# Patient Record
Sex: Female | Born: 1965 | Race: White | Hispanic: No | Smoking: Current every day smoker
Health system: Southern US, Community
[De-identification: ages and names within clinical notes are randomized; demographics above are authoritative.]

## PROBLEM LIST (undated history)

## (undated) DIAGNOSIS — K219 Gastro-esophageal reflux disease without esophagitis: Secondary | ICD-10-CM

## (undated) DIAGNOSIS — C55 Malignant neoplasm of uterus, part unspecified: Secondary | ICD-10-CM

## (undated) DIAGNOSIS — Z720 Tobacco use: Secondary | ICD-10-CM

## (undated) DIAGNOSIS — M545 Low back pain, unspecified: Secondary | ICD-10-CM

## (undated) DIAGNOSIS — J42 Unspecified chronic bronchitis: Secondary | ICD-10-CM

## (undated) DIAGNOSIS — G4700 Insomnia, unspecified: Secondary | ICD-10-CM

## (undated) DIAGNOSIS — N12 Tubulo-interstitial nephritis, not specified as acute or chronic: Secondary | ICD-10-CM

## (undated) DIAGNOSIS — F419 Anxiety disorder, unspecified: Secondary | ICD-10-CM

## (undated) DIAGNOSIS — G8929 Other chronic pain: Secondary | ICD-10-CM

## (undated) HISTORY — PX: FRACTURE SURGERY: SHX138

---

## 1991-06-19 HISTORY — PX: TUBAL LIGATION: SHX77

## 1991-06-19 HISTORY — PX: ABDOMINAL HYSTERECTOMY: SHX81

## 2011-06-19 HISTORY — PX: BILATERAL OOPHORECTOMY: SHX1221

## 2013-06-18 HISTORY — PX: FOOT FRACTURE SURGERY: SHX645

## 2015-04-25 ENCOUNTER — Emergency Department (HOSPITAL_COMMUNITY): Payer: Self-pay

## 2015-04-25 ENCOUNTER — Encounter (HOSPITAL_COMMUNITY): Payer: Self-pay | Admitting: *Deleted

## 2015-04-25 ENCOUNTER — Inpatient Hospital Stay (HOSPITAL_COMMUNITY)
Admission: EM | Admit: 2015-04-25 | Discharge: 2015-04-28 | DRG: 872 | Disposition: A | Discharge status: Home / Self Care | Payer: Self-pay | Attending: Internal Medicine | Admitting: Internal Medicine

## 2015-04-25 ENCOUNTER — Emergency Department (HOSPITAL_COMMUNITY): Payer: MEDICAID

## 2015-04-25 ENCOUNTER — Inpatient Hospital Stay (HOSPITAL_COMMUNITY): Payer: Self-pay

## 2015-04-25 DIAGNOSIS — B9620 Unspecified Escherichia coli [E. coli] as the cause of diseases classified elsewhere: Secondary | ICD-10-CM | POA: Diagnosis present

## 2015-04-25 DIAGNOSIS — M5412 Radiculopathy, cervical region: Secondary | ICD-10-CM | POA: Diagnosis present

## 2015-04-25 DIAGNOSIS — IMO0002 Reserved for concepts with insufficient information to code with codable children: Secondary | ICD-10-CM

## 2015-04-25 DIAGNOSIS — R739 Hyperglycemia, unspecified: Secondary | ICD-10-CM

## 2015-04-25 DIAGNOSIS — E1165 Type 2 diabetes mellitus with hyperglycemia: Secondary | ICD-10-CM | POA: Diagnosis present

## 2015-04-25 DIAGNOSIS — E118 Type 2 diabetes mellitus with unspecified complications: Secondary | ICD-10-CM

## 2015-04-25 DIAGNOSIS — G4700 Insomnia, unspecified: Secondary | ICD-10-CM

## 2015-04-25 DIAGNOSIS — R0789 Other chest pain: Secondary | ICD-10-CM | POA: Diagnosis present

## 2015-04-25 DIAGNOSIS — F419 Anxiety disorder, unspecified: Secondary | ICD-10-CM

## 2015-04-25 DIAGNOSIS — R079 Chest pain, unspecified: Secondary | ICD-10-CM

## 2015-04-25 DIAGNOSIS — N12 Tubulo-interstitial nephritis, not specified as acute or chronic: Secondary | ICD-10-CM | POA: Diagnosis present

## 2015-04-25 DIAGNOSIS — E860 Dehydration: Secondary | ICD-10-CM | POA: Insufficient documentation

## 2015-04-25 DIAGNOSIS — R05 Cough: Secondary | ICD-10-CM

## 2015-04-25 DIAGNOSIS — N390 Urinary tract infection, site not specified: Secondary | ICD-10-CM | POA: Diagnosis present

## 2015-04-25 DIAGNOSIS — A419 Sepsis, unspecified organism: Principal | ICD-10-CM | POA: Diagnosis present

## 2015-04-25 DIAGNOSIS — R059 Cough, unspecified: Secondary | ICD-10-CM

## 2015-04-25 DIAGNOSIS — F17210 Nicotine dependence, cigarettes, uncomplicated: Secondary | ICD-10-CM | POA: Diagnosis present

## 2015-04-25 DIAGNOSIS — Z881 Allergy status to other antibiotic agents status: Secondary | ICD-10-CM

## 2015-04-25 DIAGNOSIS — E876 Hypokalemia: Secondary | ICD-10-CM | POA: Diagnosis present

## 2015-04-25 DIAGNOSIS — Z79899 Other long term (current) drug therapy: Secondary | ICD-10-CM

## 2015-04-25 DIAGNOSIS — M542 Cervicalgia: Secondary | ICD-10-CM

## 2015-04-25 DIAGNOSIS — R109 Unspecified abdominal pain: Secondary | ICD-10-CM

## 2015-04-25 HISTORY — DX: Other chronic pain: G89.29

## 2015-04-25 HISTORY — DX: Malignant neoplasm of uterus, part unspecified: C55

## 2015-04-25 HISTORY — DX: Gastro-esophageal reflux disease without esophagitis: K21.9

## 2015-04-25 HISTORY — DX: Anxiety disorder, unspecified: F41.9

## 2015-04-25 HISTORY — DX: Insomnia, unspecified: G47.00

## 2015-04-25 HISTORY — DX: Unspecified chronic bronchitis: J42

## 2015-04-25 HISTORY — DX: Low back pain, unspecified: M54.50

## 2015-04-25 HISTORY — DX: Tobacco use: Z72.0

## 2015-04-25 HISTORY — DX: Tubulo-interstitial nephritis, not specified as acute or chronic: N12

## 2015-04-25 HISTORY — DX: Low back pain: M54.5

## 2015-04-25 LAB — CBG MONITORING, ED: Glucose-Capillary: 375 mg/dL — ABNORMAL HIGH (ref 65–99)

## 2015-04-25 LAB — COMPREHENSIVE METABOLIC PANEL
ALBUMIN: 3.3 g/dL — AB (ref 3.5–5.0)
ALT: 14 U/L (ref 14–54)
ANION GAP: 13 (ref 5–15)
AST: 18 U/L (ref 15–41)
Alkaline Phosphatase: 100 U/L (ref 38–126)
BILIRUBIN TOTAL: 0.6 mg/dL (ref 0.3–1.2)
BUN: 9 mg/dL (ref 6–20)
CHLORIDE: 91 mmol/L — AB (ref 101–111)
CO2: 25 mmol/L (ref 22–32)
Calcium: 9.7 mg/dL (ref 8.9–10.3)
Creatinine, Ser: 0.86 mg/dL (ref 0.44–1.00)
GFR calc Af Amer: >60 mL/min (ref >60)
GFR calc non Af Amer: >60 mL/min (ref >60)
GLUCOSE: 473 mg/dL — AB (ref 65–99)
POTASSIUM: 3.4 mmol/L — AB (ref 3.5–5.1)
SODIUM: 129 mmol/L — AB (ref 135–145)
TOTAL PROTEIN: 7.2 g/dL (ref 6.5–8.1)

## 2015-04-25 LAB — URINALYSIS, ROUTINE W REFLEX MICROSCOPIC
BILIRUBIN URINE: NEGATIVE
Glucose, UA: >1000 mg/dL — AB
Ketones, ur: NEGATIVE mg/dL
Leukocytes, UA: NEGATIVE
Nitrite: POSITIVE — AB
PH: 6 (ref 5.0–8.0)
Protein, ur: 30 mg/dL — AB
SPECIFIC GRAVITY, URINE: 1.039 — AB (ref 1.005–1.030)
UROBILINOGEN UA: 1 mg/dL (ref 0.0–1.0)

## 2015-04-25 LAB — LIPID PANEL
CHOL/HDL RATIO: 13.7 ratio
Cholesterol: 205 mg/dL — ABNORMAL HIGH (ref 0–200)
HDL: 15 mg/dL — AB (ref >40)
LDL Cholesterol: 139 mg/dL — ABNORMAL HIGH (ref 0–99)
Triglycerides: 254 mg/dL — ABNORMAL HIGH (ref <150)
VLDL: 51 mg/dL — AB (ref 0–40)

## 2015-04-25 LAB — URINE MICROSCOPIC-ADD ON

## 2015-04-25 LAB — CBC
HEMATOCRIT: 40.2 % (ref 36.0–46.0)
HEMOGLOBIN: 14.5 g/dL (ref 12.0–15.0)
MCH: 31.4 pg (ref 26.0–34.0)
MCHC: 36.1 g/dL — AB (ref 30.0–36.0)
MCV: 87 fL (ref 78.0–100.0)
Platelets: 263 10*3/uL (ref 150–400)
RBC: 4.62 MIL/uL (ref 3.87–5.11)
RDW: 12.3 % (ref 11.5–15.5)
WBC: 15.6 10*3/uL — ABNORMAL HIGH (ref 4.0–10.5)

## 2015-04-25 LAB — I-STAT CG4 LACTIC ACID, ED
LACTIC ACID, VENOUS: 2.78 mmol/L — AB (ref 0.5–2.0)
LACTIC ACID, VENOUS: 0.55 mmol/L (ref 0.5–2.0)

## 2015-04-25 LAB — GLUCOSE, CAPILLARY: Glucose-Capillary: 192 mg/dL — ABNORMAL HIGH (ref 65–99)

## 2015-04-25 LAB — PROCALCITONIN: PROCALCITONIN: 0.1 ng/mL

## 2015-04-25 LAB — TROPONIN I: TROPONIN I: 0.03 ng/mL (ref <0.031)

## 2015-04-25 LAB — LIPASE, BLOOD: Lipase: 71 U/L — ABNORMAL HIGH (ref 11–51)

## 2015-04-25 MED ORDER — PROMETHAZINE HCL 25 MG PO TABS
25.0000 mg | ORAL_TABLET | Freq: Once | ORAL | Status: AC
  Administered 2015-04-25: 25 mg via ORAL
  Filled 2015-04-25: qty 1

## 2015-04-25 MED ORDER — SODIUM CHLORIDE 0.9 % IV SOLN
1000.0000 mL | Freq: Once | INTRAVENOUS | Status: AC
  Administered 2015-04-25: 1000 mL via INTRAVENOUS

## 2015-04-25 MED ORDER — DEXTROSE 5 % IV SOLN
1.0000 g | INTRAVENOUS | Status: DC
  Administered 2015-04-26 – 2015-04-27 (×2): 1 g via INTRAVENOUS
  Filled 2015-04-25 (×3): qty 10

## 2015-04-25 MED ORDER — MORPHINE SULFATE (PF) 2 MG/ML IV SOLN
2.0000 mg | INTRAVENOUS | Status: DC | PRN
  Administered 2015-04-25 – 2015-04-26 (×4): 2 mg via INTRAVENOUS
  Filled 2015-04-25 (×4): qty 1

## 2015-04-25 MED ORDER — ALPRAZOLAM 0.25 MG PO TABS
0.2500 mg | ORAL_TABLET | Freq: Every evening | ORAL | Status: DC | PRN
  Administered 2015-04-25 – 2015-04-27 (×3): 0.25 mg via ORAL
  Filled 2015-04-25 (×3): qty 1

## 2015-04-25 MED ORDER — INSULIN ASPART 100 UNIT/ML ~~LOC~~ SOLN
0.0000 [IU] | Freq: Three times a day (TID) | SUBCUTANEOUS | Status: DC
  Administered 2015-04-26 (×2): 3 [IU] via SUBCUTANEOUS
  Administered 2015-04-26: 5 [IU] via SUBCUTANEOUS
  Administered 2015-04-27: 3 [IU] via SUBCUTANEOUS
  Administered 2015-04-27: 1 [IU] via SUBCUTANEOUS
  Administered 2015-04-27: 3 [IU] via SUBCUTANEOUS
  Administered 2015-04-28 (×2): 2 [IU] via SUBCUTANEOUS

## 2015-04-25 MED ORDER — ENOXAPARIN SODIUM 40 MG/0.4ML ~~LOC~~ SOLN
40.0000 mg | SUBCUTANEOUS | Status: DC
  Administered 2015-04-25 – 2015-04-27 (×3): 40 mg via SUBCUTANEOUS
  Filled 2015-04-25 (×3): qty 0.4

## 2015-04-25 MED ORDER — HYDROMORPHONE HCL 1 MG/ML IJ SOLN
1.0000 mg | Freq: Once | INTRAMUSCULAR | Status: AC
  Administered 2015-04-25: 1 mg via INTRAVENOUS
  Filled 2015-04-25: qty 1

## 2015-04-25 MED ORDER — ONDANSETRON HCL 4 MG/2ML IJ SOLN
4.0000 mg | Freq: Four times a day (QID) | INTRAMUSCULAR | Status: DC | PRN
  Administered 2015-04-27 (×2): 4 mg via INTRAVENOUS
  Filled 2015-04-25 (×2): qty 2

## 2015-04-25 MED ORDER — POTASSIUM CHLORIDE 10 MEQ/100ML IV SOLN
10.0000 meq | INTRAVENOUS | Status: AC
  Administered 2015-04-25 (×4): 10 meq via INTRAVENOUS
  Filled 2015-04-25 (×4): qty 100

## 2015-04-25 MED ORDER — HYDROMORPHONE HCL 1 MG/ML IJ SOLN
0.5000 mg | Freq: Once | INTRAMUSCULAR | Status: AC
  Administered 2015-04-25: 0.5 mg via INTRAVENOUS
  Filled 2015-04-25: qty 1

## 2015-04-25 MED ORDER — INSULIN ASPART 100 UNIT/ML ~~LOC~~ SOLN
10.0000 [IU] | Freq: Once | SUBCUTANEOUS | Status: AC
  Administered 2015-04-25: 10 [IU] via SUBCUTANEOUS

## 2015-04-25 MED ORDER — ONDANSETRON HCL 4 MG PO TABS: 4.0000 mg | ORAL_TABLET | Freq: Four times a day (QID) | ORAL | Status: DC | PRN

## 2015-04-25 MED ORDER — ACETAMINOPHEN 650 MG RE SUPP: 650.0000 mg | Freq: Four times a day (QID) | RECTAL | Status: DC | PRN

## 2015-04-25 MED ORDER — IOHEXOL 300 MG/ML  SOLN
100.0000 mL | Freq: Once | INTRAMUSCULAR | Status: AC | PRN
  Administered 2015-04-25: 100 mL via INTRAVENOUS

## 2015-04-25 MED ORDER — DEXTROSE 5 % IV SOLN
1.0000 g | Freq: Once | INTRAVENOUS | Status: AC
  Administered 2015-04-25: 1 g via INTRAVENOUS
  Filled 2015-04-25: qty 10

## 2015-04-25 MED ORDER — ACETAMINOPHEN 325 MG PO TABS: 650.0000 mg | ORAL_TABLET | Freq: Four times a day (QID) | ORAL | Status: DC | PRN

## 2015-04-25 MED ORDER — NICOTINE 21 MG/24HR TD PT24
21.0000 mg | MEDICATED_PATCH | Freq: Every day | TRANSDERMAL | Status: DC
  Administered 2015-04-25 – 2015-04-28 (×4): 21 mg via TRANSDERMAL
  Filled 2015-04-25 (×4): qty 1

## 2015-04-25 MED ORDER — SODIUM CHLORIDE 0.9 % IV BOLUS (SEPSIS): 1000.0000 mL | Freq: Once | INTRAVENOUS | Status: DC

## 2015-04-25 MED ORDER — SODIUM CHLORIDE 0.9 % IV SOLN
INTRAVENOUS | Status: DC
  Administered 2015-04-25 – 2015-04-28 (×5): via INTRAVENOUS

## 2015-04-25 MED ORDER — ONDANSETRON HCL 4 MG/2ML IJ SOLN
4.0000 mg | Freq: Once | INTRAMUSCULAR | Status: AC
  Administered 2015-04-25: 4 mg via INTRAVENOUS
  Filled 2015-04-25: qty 2

## 2015-04-25 NOTE — ED Notes (Signed)
Attempted report to 6N 

## 2015-04-25 NOTE — H&P (Signed)
[facility] History and Physical  Bethany Mccarthy WOE:[id number] DOB: 1966/03/29 DOA: 04/25/2015  Referring physician: Dr. Davonna Belling, EDP PCP: No primary care provider on file.  Specialists:   Chief Complaint: Abdominal pain, nausea and vomting  HPI: Bethany Mccarthy is a 49 y.o. female  With past medical history of insomnia and anxiety that presented to the emergency department with complaints of back pain and abdominal pain along with nausea and vomiting. Patient states this is been going on for approximately one week however has been worsening. She has been vomiting more often. She's been unable to drink or eat anything. Patient states she's been having lots of back pain as well as abdominal pain. She also complains of left shoulder and chest pain which has been intermittently going on for approximately 1 week. She states the pain is sharp in character. Patient also complains of dysuria and increased thirst. At this time, she denies any shortness of breath but does admit to continued smoking. She states she is new to the area and does not have a primary care physician.  In the emergency department, patient was noted to have urinary tract infection, CT showed bilateral pyelonephritis. Patient was also found to be septic. Patient also had a blood sugar 473, lactic acid 2.78. TR[facility] called for admission.  Review of Systems:  Constitutional: Denies fever, chills, diaphoresis, appetite change and fatigue.  HEENT: Denies photophobia, eye pain, redness, hearing loss, ear pain, congestion, sore throat, rhinorrhea, sneezing, mouth sores, trouble swallowing, neck pain, neck stiffness and tinnitus.   Respiratory: Denies SOB, DOE, cough, chest tightness,  and wheezing.   Cardiovascular: Denies chest pain, palpitations and leg swelling.  Gastrointestinal: Complains of abdominal pain, nausea and vomiting Genitourinary: Complains of dysuria Musculoskeletal: Complains of back pain  Skin: Denies  pallor, rash and wound.  Neurological: Denies dizziness, seizures, syncope, weakness, light-headedness, numbness and headaches.  Hematological: Denies adenopathy. Easy bruising, personal or family bleeding history  Psychiatric/Behavioral: Denies suicidal ideation, mood changes, confusion, nervousness, sleep disturbance and agitation  Past Medical History  Diagnosis Date  . Gynecologic cancer ([place])   . Anxiety   . Insomnia   . Tobacco abuse    Past surgical history Hysterectomy  Social History:  reports that she has been smoking Cigarettes.  She does not have any smokeless tobacco history on file. She reports that she does not drink alcohol or use illicit drugs.   Allergies  Allergen Reactions  . Levaquin [Levofloxacin In D5w] Hives  . Sulfa Antibiotics Hives   Family History Grandmother had some type of cancer.  Prior to Admission medications   Medication Sig Start Date End Date Taking? Authorizing Provider  ALPRAZolam (XANAX PO) Take 1 tablet by mouth daily as needed (anxiety).   Yes Historical Provider, MD   Physical Exam: Filed Vitals:   04/25/15 1630  BP: 103/80  Pulse: 94  Temp:   Resp: 29     General: Well developed, well nourished, NAD, appears stated age  HEENT: NCAT, PERRLA, EOMI, Anicteic Sclera, mucous membranes moist.   Neck: Supple, no JVD, no masses  Cardiovascular: S1 S2 auscultated, no rubs, murmurs or gallops. Regular rate and rhythm.  Respiratory: Mild expiratory wheeze  Abdomen: Soft, generalized TTP-worse in RUQ , nondistended, + bowel sounds  Back: +CVA tenderness B/L  Extremities: warm dry without cyanosis clubbing or edema  Neuro: AAOx3, cranial nerves grossly intact. Strength 5/5 in patient's upper and lower extremities bilaterally  Skin: Without rashes exudates or nodules  Psych: Anxious however appropriate  Labs on Admission:  Basic Metabolic Panel:  Recent Labs Lab 04/25/15 1057  NA 129*  K 3.4*  CL 91*  CO2 25    GLUCOSE 473*  BUN 9  CREATININE 0.86  CALCIUM 9.7   Liver Function Tests:  Recent Labs Lab 04/25/15 1057  AST 18  ALT 14  ALKPHOS 100  BILITOT 0.6  PROT 7.2  ALBUMIN 3.3*    Recent Labs Lab 04/25/15 1057  LIPASE 71*   No results for input(s): AMMONIA in the last 168 hours. CBC:  Recent Labs Lab 04/25/15 1057  WBC 15.6*  HGB 14.5  HCT 40.2  MCV 87.0  PLT 263   Cardiac Enzymes: No results for input(s): CKTOTAL, CKMB, CKMBINDEX, TROPONINI in the last 168 hours.  BNP (last 3 results) No results for input(s): BNP in the last 8760 hours.  ProBNP (last 3 results) No results for input(s): PROBNP in the last 8760 hours.  CBG:  Recent Labs Lab 04/25/15 1202  GLUCAP 375*    Radiological Exams on Admission: US Abdomen Complete  04/25/2015  CLINICAL DATA:  Right upper quadrant pain EXAM: ULTRASOUND ABDOMEN COMPLETE COMPARISON:  None. FINDINGS: Gallbladder: No gallstones or wall thickening visualized. No sonographic Murphy sign noted. Common bile duct: Diameter: Normal caliber, 3 mm Liver: No focal lesion identified. Within normal limits in parenchymal echogenicity. IVC: No abnormality visualized. Pancreas: Visualized portion unremarkable. Spleen: Size and appearance within normal limits. Right Kidney: Length: 13.3 cm. Echogenicity within normal limits. No mass or hydronephrosis visualized. Left Kidney: Length: 12.3 cm. Echogenicity within normal limits. No mass or hydronephrosis visualized. Abdominal aorta: No aneurysm visualized. Other findings: None. IMPRESSION: Normal study. Electronically Signed   By: Rolm Baptise M.D.   On: 04/25/2015 14:00   Ct Abdomen Pelvis W Contrast  04/25/2015  CLINICAL DATA:  Right lower quadrant and back pain. EXAM: CT ABDOMEN AND PELVIS WITH CONTRAST TECHNIQUE: Multidetector CT imaging of the abdomen and pelvis was performed using the standard protocol following bolus administration of intravenous contrast. CONTRAST:  134mL OMNIPAQUE  IOHEXOL 300 MG/ML  SOLN COMPARISON:  None. FINDINGS: Dependent atelectasis in the lung bases. No effusions. Heart is normal size. Liver, gallbladder, spleen, pancreas, right adrenal is unremarkable. Areas of poor enhancement noted in the kidneys bilaterally including mid and lower pole on the right and predominately lower pole on the left compatible with pyelonephritis. No hydronephrosis. Small nonobstructing stone in the mid to lower pole of the right kidney. Appendix is visualized and is normal. Prior hysterectomy. No adnexal masses. Gas is noted in the urinary bladder presumably from recent catheterization. Recommend clinical correlation. Stomach, large and small bowel unremarkable. Aortic calcifications and atherosclerosis in the aorta and iliac vessels. No free fluid, free air or adenopathy. No acute bony abnormality or focal bone lesion. IMPRESSION: Areas of poor enhancement in the kidneys bilaterally compatible with bilateral pyelonephritis. Right punctate mid to lower pole nephrolithiasis. No hydronephrosis. Aortic atherosclerosis, advanced for patient's age. Gas within the urinary bladder, presumably related to recent catheterization. Recommend clinical correlation. Electronically Signed   By: Rolm Baptise M.D.   On: 04/25/2015 16:06    EKG: None  Assessment/Plan  Sepsis secondary to pyelonephritis -Patient be admitted to medical floor -Upon admission, patient did have leukocytosis with tachycardia, tachypnea, lactic acid of 2.78 -Patient did receive fluids, lactic acid has improved, currently 0.55 -Ultrasound of the abdomen normal -CT pelvis with contrast: Bilateral pyelonephritis, right punctate mid to lower pole nephrolithiasis, no hydronephrosis -UA shows 21 and 50 WBC, many  bacteria, positive nitrites -Urine and blood cultures pending -Will place patient on ceftriaxone -Continue pain control  Chest pain/left shoulder pain -Will obtain CXR, EKG and cycle troponins -Suspect secondary  to nausea and vomiting  Hyperglycemia -Likely has undiagnosed diabetes -Blood sugar upon admission 473 -Anion gap 13 -Will place patient on insulin size to CBG monitoring and obtain hemoglobin A1c  Pseudohyponatremia/Hypochloremia/ Dehydration -Likely secondary to hyperglycemia, dehydration -Continue to monitor BMP, will place on normal saline  Hypokalemia -Will replace  -obtain magnesium level  Nausea and vomiting -Likely secondary to sepsis/pyelonephritis -Will place on clear liquid diet and advance as tolerated to carp modified -Will place on PRN antiemetics  Anxiety/insomnia -Continue Xanax QHS PRN  Tobacco abuse -Discussed smoking cessation counseling -Will order nicotine patch  DVT prophylaxis: Lovenox  Code Status: Full  Condition: guarded   Family Communication: None at bedside.  Admission, patients condition and plan of care including tests being ordered have been discussed with the patient, who indicates understanding and agrees with the plan and Code Status.  Disposition Plan: Admitted  Time spent: 65 minutes  Levora Werden D.O. [facility] Pager [phone]  If 7PM-7AM, please contact night-coverage [email or web address] Password [email or web address] 04/25/2015, 4:50 PM

## 2015-04-25 NOTE — ED Notes (Signed)
Pt reports bilateral lower back pain, lower abd pain and n/v for days. Denies urinary symptoms. Also reports fever, fatigue and left arm tingling x 3 days.

## 2015-04-25 NOTE — ED Notes (Signed)
Patient transported to CT 

## 2015-04-25 NOTE — ED Notes (Signed)
Patient transported to Ultrasound 

## 2015-04-25 NOTE — ED Notes (Signed)
Pt in CT.

## 2015-04-25 NOTE — ED Provider Notes (Signed)
CSN: [id number]     Arrival date & time 04/25/15  1007 History   First MD Initiated Contact with Patient 04/25/15 1152     Chief Complaint  Patient presents with  . Back Pain  . Abdominal Pain  . Emesis     (Consider location/radiation/quality/duration/timing/severity/associated sxs/prior Treatment) The history is provided by the patient and medical records. No language interpreter was used.    Bethany Mccarthy is a 49 y.o. female  with no significant PMH who presents to the Emergency Department complaining of worsening generalized, constant abdominal pain x 1 week. Patient denies any alleviating or aggravating factors and denies history of similar sxs. Associated symptoms include nausea, NBNB emesis. Pt. Also admits to increased thirst. Patient admits Patient states no history of DM.   History reviewed. No pertinent past medical history. History reviewed. No pertinent past surgical history. History reviewed. No pertinent family history. Social History  Substance Use Topics  . Smoking status: Current Every Day Smoker    Types: Cigarettes  . Smokeless tobacco: None  . Alcohol Use: No   OB History    No data available     Review of Systems  Constitutional: Positive for fever (Subjective) and appetite change. Negative for activity change and unexpected weight change.  HENT: Positive for rhinorrhea. Negative for congestion and sore throat.   Eyes: Negative for visual disturbance.  Respiratory: Negative for cough, shortness of breath and wheezing.   Cardiovascular: Negative.   Gastrointestinal: Positive for nausea, vomiting and abdominal pain. Negative for diarrhea and constipation.  Endocrine: Positive for polydipsia. Negative for polyuria.  Genitourinary: Negative for dysuria and urgency.  Musculoskeletal: Negative for myalgias, back pain, arthralgias and neck pain.  Skin: Negative for rash.  Neurological: Negative for dizziness, weakness and headaches.      Allergies   Levaquin and Sulfa antibiotics  Home Medications   Prior to Admission medications   Medication Sig Start Date End Date Taking? Authorizing Provider  ALPRAZolam (XANAX PO) Take 1 tablet by mouth daily as needed (anxiety).   Yes Historical Provider, MD   BP 117/65 mmHg  Pulse 74  Temp(Src) 98 F (36.7 C) (Oral)  Resp 13  Ht 5\' 6"  (1.676 m)  Wt 140 lb (63.504 kg)  BMI 22.61 kg/m2  SpO2 99% Physical Exam  Constitutional: She is oriented to person, place, and time. She appears well-developed and well-nourished.  Alert, appears in pain and uncomfortable but in NAD  HENT:  Head: Normocephalic and atraumatic.  Cardiovascular: Normal rate, regular rhythm, normal heart sounds and intact distal pulses.  Exam reveals no gallop and no friction rub.   No murmur heard. Pulmonary/Chest: Effort normal and breath sounds normal. No respiratory distress. She has no wheezes. She has no rales. She exhibits no tenderness.  Abdominal: She exhibits no mass. There is no rebound.  Abdomen soft, non-distended Generalized tenderness to palpation, worst in RUQ (+) Murphy's  Bowel sounds positive in all four quadrants   Musculoskeletal: She exhibits no edema.  Neurological: She is alert and oriented to person, place, and time.  Alert, oriented, thought content appropriate, able to give a coherent history. Speech is clear and goal oriented, able to follow commands.   Cranial Nerves:  II:  Peripheral visual fields grossly normal, pupils equal, round, reactive to light III, IV, VI: EOM intact bilaterally, ptosis not present V,VII: smile symmetric, eyes kept closed tightly against resistance, facial light touch sensation equal IX, X: symmetric soft palate movement, uvula elevates symmetrically  XI: bilateral shoulder  shrug symmetric and strong XII: midline tongue extension  5/5 muscle strength in upper and lower extremities bilaterally including strong and equal grip strength and dorsiflexion/plantar  flexion Sensory to light touch normal in bilateral LE's and RUE Decreased sensation of left UE      Skin: Skin is warm and dry. No rash noted.  Psychiatric: She has a normal mood and affect. Her behavior is normal. Judgment and thought content normal.  Nursing note and vitals reviewed.   ED Course  Procedures (including critical care time) Labs Review Labs Reviewed  LIPASE, BLOOD - Abnormal; Notable for the following:    Lipase 71 (*)    All other components within normal limits  COMPREHENSIVE METABOLIC PANEL - Abnormal; Notable for the following:    Sodium 129 (*)    Potassium 3.4 (*)    Chloride 91 (*)    Glucose, Bld 473 (*)    Albumin 3.3 (*)    All other components within normal limits  CBC - Abnormal; Notable for the following:    WBC 15.6 (*)    MCHC 36.1 (*)    All other components within normal limits  URINALYSIS, ROUTINE W REFLEX MICROSCOPIC (NOT AT [facility]) - Abnormal; Notable for the following:    APPearance CLOUDY (*)    Specific Gravity, Urine 1.039 (*)    Glucose, UA >1000 (*)    Hgb urine dipstick SMALL (*)    Protein, ur 30 (*)    Nitrite POSITIVE (*)    All other components within normal limits  URINE MICROSCOPIC-ADD ON - Abnormal; Notable for the following:    Squamous Epithelial / LPF FEW (*)    Bacteria, UA MANY (*)    All other components within normal limits  I-STAT CG4 LACTIC ACID, ED - Abnormal; Notable for the following:    Lactic Acid, Venous 2.78 (*)    All other components within normal limits  CBG MONITORING, ED - Abnormal; Notable for the following:    Glucose-Capillary 375 (*)    All other components within normal limits  URINE CULTURE  I-STAT CG4 LACTIC ACID, ED    Imaging Review US Abdomen Complete  04/25/2015  CLINICAL DATA:  Right upper quadrant pain EXAM: ULTRASOUND ABDOMEN COMPLETE COMPARISON:  None. FINDINGS: Gallbladder: No gallstones or wall thickening visualized. No sonographic Murphy sign noted. Common bile duct: Diameter:  Normal caliber, 3 mm Liver: No focal lesion identified. Within normal limits in parenchymal echogenicity. IVC: No abnormality visualized. Pancreas: Visualized portion unremarkable. Spleen: Size and appearance within normal limits. Right Kidney: Length: 13.3 cm. Echogenicity within normal limits. No mass or hydronephrosis visualized. Left Kidney: Length: 12.3 cm. Echogenicity within normal limits. No mass or hydronephrosis visualized. Abdominal aorta: No aneurysm visualized. Other findings: None. IMPRESSION: Normal study. Electronically Signed   By: Rolm Baptise M.D.   On: 04/25/2015 14:00   Ct Abdomen Pelvis W Contrast  04/25/2015  CLINICAL DATA:  Right lower quadrant and back pain. EXAM: CT ABDOMEN AND PELVIS WITH CONTRAST TECHNIQUE: Multidetector CT imaging of the abdomen and pelvis was performed using the standard protocol following bolus administration of intravenous contrast. CONTRAST:  133mL OMNIPAQUE IOHEXOL 300 MG/ML  SOLN COMPARISON:  None. FINDINGS: Dependent atelectasis in the lung bases. No effusions. Heart is normal size. Liver, gallbladder, spleen, pancreas, right adrenal is unremarkable. Areas of poor enhancement noted in the kidneys bilaterally including mid and lower pole on the right and predominately lower pole on the left compatible with pyelonephritis. No hydronephrosis. Small nonobstructing stone  in the mid to lower pole of the right kidney. Appendix is visualized and is normal. Prior hysterectomy. No adnexal masses. Gas is noted in the urinary bladder presumably from recent catheterization. Recommend clinical correlation. Stomach, large and small bowel unremarkable. Aortic calcifications and atherosclerosis in the aorta and iliac vessels. No free fluid, free air or adenopathy. No acute bony abnormality or focal bone lesion. IMPRESSION: Areas of poor enhancement in the kidneys bilaterally compatible with bilateral pyelonephritis. Right punctate mid to lower pole nephrolithiasis. No  hydronephrosis. Aortic atherosclerosis, advanced for patient's age. Gas within the urinary bladder, presumably related to recent catheterization. Recommend clinical correlation. Electronically Signed   By: Rolm Baptise M.D.   On: 04/25/2015 16:06   I have personally reviewed and evaluated these images and lab results as part of my medical decision-making.   EKG Interpretation None      MDM   Final diagnoses:  Abdominal pain  Pyelonephritis   Vaeda Westall presents with worsening abdominal pain x 1 week. (+) murphys, history, exam leans toward gallbladder as likely etiology. Elevated glucose also a consideration. Pancreatitis considered but would expect lipase to be higher and this is not as consistent with history.  Labs: CBG 375, Lactic acid 2.78, Lipase 71, CMP: na 129, k 3.4, cl 91, glucose 473, albumin 3.3; white count 15.6 U/S: normal study, no gallstones or wall thickening.  UA: nitrite +, many bacteria - patient has UTI but will still CT abd because pain level exceeds that of uncomplicated UTI. Will r/o GI etiology for pain. No ketones in urine, DKA not likely.  CT shows signs compatible with bilateral pyelonephritis which is consistent with history and physical exam findings.   Consult to internal medicine who will admit.  Urine culture added and Rocephin started in ED per IM recommendations.   Patient seen by and discussed with Dr. Alvino Chapel who agrees with treatment plan.    [facility] Carmisha Larusso, PA-C 04/25/15 1633  Davonna Belling, MD 04/25/15 [phone]

## 2015-04-26 ENCOUNTER — Encounter (HOSPITAL_COMMUNITY): Payer: Self-pay | Admitting: General Practice

## 2015-04-26 DIAGNOSIS — IMO0002 Reserved for concepts with insufficient information to code with codable children: Secondary | ICD-10-CM

## 2015-04-26 DIAGNOSIS — E1165 Type 2 diabetes mellitus with hyperglycemia: Secondary | ICD-10-CM

## 2015-04-26 LAB — GLUCOSE, CAPILLARY
GLUCOSE-CAPILLARY: 215 mg/dL — AB (ref 65–99)
Glucose-Capillary: 217 mg/dL — ABNORMAL HIGH (ref 65–99)
Glucose-Capillary: 279 mg/dL — ABNORMAL HIGH (ref 65–99)

## 2015-04-26 LAB — CBC
HEMATOCRIT: 35 % — AB (ref 36.0–46.0)
Hemoglobin: 12.1 g/dL (ref 12.0–15.0)
MCH: 30.8 pg (ref 26.0–34.0)
MCHC: 34.6 g/dL (ref 30.0–36.0)
MCV: 89.1 fL (ref 78.0–100.0)
Platelets: 230 10*3/uL (ref 150–400)
RBC: 3.93 MIL/uL (ref 3.87–5.11)
RDW: 12.2 % (ref 11.5–15.5)
WBC: 9.6 10*3/uL (ref 4.0–10.5)

## 2015-04-26 LAB — BASIC METABOLIC PANEL
Anion gap: 7 (ref 5–15)
CHLORIDE: 102 mmol/L (ref 101–111)
CO2: 25 mmol/L (ref 22–32)
Calcium: 8.2 mg/dL — ABNORMAL LOW (ref 8.9–10.3)
Creatinine, Ser: 0.53 mg/dL (ref 0.44–1.00)
GFR calc Af Amer: >60 mL/min (ref >60)
GLUCOSE: 216 mg/dL — AB (ref 65–99)
POTASSIUM: 3.6 mmol/L (ref 3.5–5.1)
Sodium: 134 mmol/L — ABNORMAL LOW (ref 135–145)

## 2015-04-26 LAB — HEMOGLOBIN A1C
Hgb A1c MFr Bld: 10.1 % — ABNORMAL HIGH (ref 4.8–5.6)
MEAN PLASMA GLUCOSE: 243 mg/dL

## 2015-04-26 LAB — TROPONIN I
Troponin I: <0.03 ng/mL (ref <0.031)
Troponin I: 0.03 ng/mL (ref <0.031)

## 2015-04-26 MED ORDER — INSULIN GLARGINE 100 UNIT/ML ~~LOC~~ SOLN
10.0000 [IU] | Freq: Every day | SUBCUTANEOUS | Status: DC
  Administered 2015-04-26 – 2015-04-27 (×2): 10 [IU] via SUBCUTANEOUS
  Filled 2015-04-26 (×3): qty 0.1

## 2015-04-26 MED ORDER — FLUOXETINE HCL 10 MG PO CAPS
10.0000 mg | ORAL_CAPSULE | Freq: Every day | ORAL | Status: DC
  Administered 2015-04-26 – 2015-04-28 (×3): 10 mg via ORAL
  Filled 2015-04-26 (×4): qty 1

## 2015-04-26 MED ORDER — FLUCONAZOLE 100 MG PO TABS
150.0000 mg | ORAL_TABLET | Freq: Once | ORAL | Status: AC
  Administered 2015-04-26: 150 mg via ORAL
  Filled 2015-04-26: qty 2

## 2015-04-26 MED ORDER — LIVING WELL WITH DIABETES BOOK
Freq: Once | Status: AC
  Administered 2015-04-26: 11:00:00
  Filled 2015-04-26: qty 1

## 2015-04-26 MED ORDER — WHITE PETROLATUM GEL
Status: AC
  Administered 2015-04-26: 18:00:00
  Filled 2015-04-26: qty 1

## 2015-04-26 MED ORDER — HYDROCODONE-ACETAMINOPHEN 5-325 MG PO TABS
1.0000 | ORAL_TABLET | ORAL | Status: DC | PRN
  Administered 2015-04-26 – 2015-04-28 (×8): 2 via ORAL
  Filled 2015-04-26 (×8): qty 2

## 2015-04-26 MED ORDER — MORPHINE SULFATE (PF) 2 MG/ML IV SOLN
2.0000 mg | INTRAVENOUS | Status: DC | PRN
  Administered 2015-04-26 – 2015-04-28 (×9): 2 mg via INTRAVENOUS
  Filled 2015-04-26 (×9): qty 1

## 2015-04-26 NOTE — Progress Notes (Signed)
Brief Nutrition Note  RD received consult for diet education. Pt with newly diagnosed DM.   Lab Results  Component Value Date   HGBA1C 10.1* 04/25/2015   Spoke with RN, who reports pt is aware of diagnosis as MD had just been discussing elevated CBGS with pt earlier this AM.  Pt was sleeping soundly at time of visit; unable to arise enough for education. Left AND Nutrition Care Manual's "Carbohydrate Counting for People with Diabetes" handout at bedside for pt to review.   Will attempt to follow-up with pt for reinforcement of principles.   Current diet order is regular, pt consuming 100% of meals. Consider changing diet order to carb modified.   Marce Schartz A. Jimmye Norman, RD, LDN, CDE Pager: [phone] After hours Pager: [phone]

## 2015-04-26 NOTE — Progress Notes (Addendum)
Inpatient Diabetes Program Recommendations  AACE/ADA: New Consensus Statement on Inpatient Glycemic Control (2015)  Target Ranges:  Prepandial:   less than 140 mg/dL      Peak postprandial:   less than 180 mg/dL (1-2 hours)      Critically ill patients:  140 - 180 mg/dL  Results for Bethany Mccarthy, Bethany Mccarthy (MRN [id number]) as of 04/26/2015 10:12  Ref. Range 04/25/2015 12:02 04/25/2015 21:41 04/26/2015 07:53  Glucose-Capillary Latest Ref Range: 65-99 mg/dL 375 (H) 192 (H) 217 (H)  Results for Bethany Mccarthy, Bethany Mccarthy (MRN [id number]) as of 04/26/2015 10:12  Ref. Range 04/25/2015 10:57 04/25/2015 19:17 04/26/2015 05:45  Hemoglobin A1C Latest Ref Range: 4.8-5.6 %  10.1 (H)   Glucose Latest Ref Range: 65-99 mg/dL 473 (H)  216 (H)   Review of Glycemic Control  Diabetes history: No Outpatient Diabetes medications: NA Current orders for Inpatient glycemic control: Novolog 0-9 units TID with meals  Inpatient Diabetes Program Recommendations: Oral Agents: May want to consider starting patient on affordable oral DM medication such as Metformin and Amaryl. HgbA1C: A1C 10.1% on 04/25/15 which meets ADA criteria for diagnosis of DM. MD, please inform patient of new DM dx. Also, inform nursing staff so they can begin DM education.  04/26/15@14 :40-Spoke with patient about new diabetes diagnosis. Patient reports that her grandmother had diabetes and had several complications from diabetes. Patient also stated that she was told about 2 years ago in [place] that her "pancreas did not work." Patient states that she was not told anything else about her pancreas and was never started on any type of medication. Patient does not have any insurance and no PCP.  Discussed A1C results (10.1% on 04/25/15) and explained what an A1C is, basic pathophysiology of DM Type 2, basic home care, importance of checking CBGs and maintaining good CBG control to prevent long-term and short-term complications. Discussed impact of nutrition, exercise,  stress, sickness, and medications on diabetes control. Discussed carbohydrates, carbohydrate goals per day and meal, along with portion sizes. 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 Informed patient that RD had been consulted and will provide further information on Carb Modified diet. Informed patient about [facility] and that Case Management would be consulted for arrange follow up and to see if they can provide any assistance with medications at time of discharge. Patient will need affordable DM medications for discharge. Recommend trying patient on oral DM medication while inpatient to determine effect on glucose and then have patient follow up at [facility] for glycemic control. Discussed glucose and A1C goals and explained how to monitor glucose. Patient states that she is familiar with how to check glucose as she saw her grandmother check her glucose in the past. Encouraged patient to check glucose as recommended by the doctor and to keep a log of glucose readings which she will need to take with her to follow up visits. Explained how glucose log can be used to help the doctor make adjustments with DM medications. Informed patient she should be receiving the Living Well with Diabetes Booklet and that needs to read through the book so any questions she may have can be addressed before discharge.  Patient verbalized understanding of information discussed and she states that she has no further questions at this time related to diabetes. RNs to provide ongoing basic DM education at bedside with this patient and  engage patient to actively check blood glucose and administer insulin injections.       At time of discharge, patient will need a prescription for a glucometer and testing supplies along with DM medications. If patient follows up at the [facility]  and [facility] she will be able to take the prescription for glucometer and testing supplies there and get supplies from the [facility] pharmacy. Also, if patient is prescribed a DM medication the clinic has samples of then she can take the prescription there and they will provide her with a sample of the medication.  Thanks, Barnie Alderman, RN, MSN, CDE Diabetes Coordinator Inpatient Diabetes Program [phone] (Team Pager from [place] to [place]) [phone] ([place] office) [phone] [facility] office) [phone] [facility] office)

## 2015-04-26 NOTE — Progress Notes (Signed)
[facility] PROGRESS NOTE  Bethany Mccarthy KGM:[id number] DOB: 1966-05-09 DOA: 04/25/2015 PCP: No PCP Per Patient  Brief Summary  Bethany Mccarthy is a 49 y.o. female with history of insomnia and anxiety who recently moved her from [place] who presented to the emergency department with back pain, abdominal pain, nausea and vomiting.  She also complained of left shoulder and chest pain which had been intermittently going on for approximately 1 week. She endorsed dysuria and increased thirst.   In the emergency department, she was found to have a urinary tract infection, CT showed bilateral pyelonephritis.  She was septic with tachycardia and leukocytosis. Patient also had a blood sugar 473, lactic acid 2.78. [facility] called for admission.  Assessment/Plan  Sepsis secondary to pyelonephritis -Upon admission, patient did have leukocytosis with tachycardia, tachypnea, lactic acid of 2.78 -Ultrasound of the abdomen normal -CT pelvis with contrast: Bilateral pyelonephritis, right punctate mid to lower pole nephrolithiasis, no hydronephrosis -UA shows 21 and 50 WBC, many bacteria, positive nitrites -Urine and blood cultures pending - Continue ceftriaxone - change to vicodin with morphine for breakthrough pain -  Continue antiemetics  Chest pain/left shoulder pain, likely due to cervical radiculopathy -  Asked her to talk to her PCP about a referral for injections if it does not improve - Troponins negative -  CXR unremarkable  New diagnosis of diabetes mellitus - A1c 10.1 -  CBG still elevated -  Start lantus 10 units  -  Continue low dose SSI   Pseudohyponatremia/Hypochloremia/ Dehydration, resolving with IVF and correction of hyperglycemia -  Continue IVF -  Repeat BMP in AM  Hypokalemia, resolved with supplementation -Will replace  -obtain magnesium level  Nausea and vomiting, likely secondary to sepsis/pyelonephritis - resolving -  Advance to diabetic  diet  Anxiety/insomnia -Continue Xanax QHS PRN -  Start fluoxetine  Tobacco abuse -Discussed smoking cessation counseling - nicotine patch  Diet:  diabetic Access:  PIV IVF:  yes Proph:  lovenox  Code Status: full Family Communication: patient alone Disposition Plan: pending tolerating diet, further education about diabetes   Consultants:  Diabetic coordinator  Procedures:  CT ab/p  Abd [place]  Antibiotics:  Ceftriaxone 11/7 >   HPI/Subjective:  Denies vomiting and states that she has not had much nausea.  Her pain medication is not lasting very long and she asked me to adjust her medications.  She feels anxious and has been crying.  Pain has not improved.  No BMs since admission.    Objective: Filed Vitals:   04/25/15 1730 04/25/15 1752 04/25/15 2122 04/26/15 0458  BP: 130/75 118/70 102/52 100/66  Pulse: 84 80 81 78  Temp:  98 F (36.7 C) 98.4 F (36.9 C) 98.4 F (36.9 C)  TempSrc:  Oral Oral Oral  Resp: 14 16 17 19   Height:  5\' 6"  (1.676 m)    Weight:  63.5 kg (139 lb 15.9 oz)    SpO2: 100% 100% 100% 100%    Intake/Output Summary (Last 24 hours) at 04/26/15 1234 Last data filed at 04/26/15 0900  Gross per 24 hour  Intake   2070 ml  Output   1900 ml  Net    170 ml   Filed Weights   04/25/15 1054 04/25/15 1752  Weight: 63.504 kg (140 lb) 63.5 kg (139 lb 15.9 oz)   Body mass index is 22.61 kg/(m^2).  Exam:   General:  Tearful adult female, No acute distress  HEENT:  NCAT, MMM, loss of bilateral nasolabial folds suggestive  of botox injection  Cardiovascular:  RRR, nl S1, S2 no mrg, 2+ pulses, warm extremities  Respiratory:  CTAB, no increased WOB  Abdomen:   NABS, soft, ND, mild TTP diffusely without rebound or guarding  MSK:   Normal tone and bulk, no LEE  Neuro:  Grossly intact  Data Reviewed: Basic Metabolic Panel:  Recent Labs Lab 04/25/15 1057 04/26/15 0545  NA 129* 134*  K 3.4* 3.6  CL 91* 102  CO2 25 25  GLUCOSE 473* 216*   BUN 9 <5*  CREATININE 0.86 0.53  CALCIUM 9.7 8.2*   Liver Function Tests:  Recent Labs Lab 04/25/15 1057  AST 18  ALT 14  ALKPHOS 100  BILITOT 0.6  PROT 7.2  ALBUMIN 3.3*    Recent Labs Lab 04/25/15 1057  LIPASE 71*   No results for input(s): AMMONIA in the last 168 hours. CBC:  Recent Labs Lab 04/25/15 1057 04/26/15 0545  WBC 15.6* 9.6  HGB 14.5 12.1  HCT 40.2 35.0*  MCV 87.0 89.1  PLT 263 230    No results found for this or any previous visit (from the past 240 hour(s)).   Studies: US Abdomen Complete  04/25/2015  CLINICAL DATA:  Right upper quadrant pain EXAM: ULTRASOUND ABDOMEN COMPLETE COMPARISON:  None. FINDINGS: Gallbladder: No gallstones or wall thickening visualized. No sonographic Murphy sign noted. Common bile duct: Diameter: Normal caliber, 3 mm Liver: No focal lesion identified. Within normal limits in parenchymal echogenicity. IVC: No abnormality visualized. Pancreas: Visualized portion unremarkable. Spleen: Size and appearance within normal limits. Right Kidney: Length: 13.3 cm. Echogenicity within normal limits. No mass or hydronephrosis visualized. Left Kidney: Length: 12.3 cm. Echogenicity within normal limits. No mass or hydronephrosis visualized. Abdominal aorta: No aneurysm visualized. Other findings: None. IMPRESSION: Normal study. Electronically Signed   By: Rolm Baptise M.D.   On: 04/25/2015 14:00   Ct Abdomen Pelvis W Contrast  04/25/2015  CLINICAL DATA:  Right lower quadrant and back pain. EXAM: CT ABDOMEN AND PELVIS WITH CONTRAST TECHNIQUE: Multidetector CT imaging of the abdomen and pelvis was performed using the standard protocol following bolus administration of intravenous contrast. CONTRAST:  137mL OMNIPAQUE IOHEXOL 300 MG/ML  SOLN COMPARISON:  None. FINDINGS: Dependent atelectasis in the lung bases. No effusions. Heart is normal size. Liver, gallbladder, spleen, pancreas, right adrenal is unremarkable. Areas of poor enhancement noted in  the kidneys bilaterally including mid and lower pole on the right and predominately lower pole on the left compatible with pyelonephritis. No hydronephrosis. Small nonobstructing stone in the mid to lower pole of the right kidney. Appendix is visualized and is normal. Prior hysterectomy. No adnexal masses. Gas is noted in the urinary bladder presumably from recent catheterization. Recommend clinical correlation. Stomach, large and small bowel unremarkable. Aortic calcifications and atherosclerosis in the aorta and iliac vessels. No free fluid, free air or adenopathy. No acute bony abnormality or focal bone lesion. IMPRESSION: Areas of poor enhancement in the kidneys bilaterally compatible with bilateral pyelonephritis. Right punctate mid to lower pole nephrolithiasis. No hydronephrosis. Aortic atherosclerosis, advanced for patient's age. Gas within the urinary bladder, presumably related to recent catheterization. Recommend clinical correlation. Electronically Signed   By: Rolm Baptise M.D.   On: 04/25/2015 16:06   Dg Chest Port 1 View  04/25/2015  CLINICAL DATA:  Left chest pain radiating into the left shoulder for 1 week. Initial encounter. EXAM: PORTABLE CHEST 1 VIEW COMPARISON:  None. FINDINGS: Heart size and mediastinal contours are within normal limits. Both lungs  are clear. Visualized skeletal structures are unremarkable. IMPRESSION: Negative exam. Electronically Signed   By: Inge Rise M.D.   On: 04/25/2015 18:40    Scheduled Meds: . cefTRIAXone (ROCEPHIN)  IV  1 g Intravenous Q24H  . enoxaparin (LOVENOX) injection  40 mg Subcutaneous Q24H  . FLUoxetine  10 mg Oral Daily  . insulin aspart  0-9 Units Subcutaneous TID WC  . living well with diabetes book   Does not apply Once  . nicotine  21 mg Transdermal Daily   Continuous Infusions: . sodium chloride 125 mL/hr at 04/26/15 1026    Active Problems:   Pyelonephritis   Sepsis secondary to UTI ([place])   Hyperglycemia   Insomnia    Anxiety   Chest pain    Time spent: 30 min    Jaquaveon Bilal, [facility] Pager [phone]. If 7PM-7AM, please contact night-coverage at [email or web address], password [facility] 04/26/2015, 12:34 PM  LOS: 1 day

## 2015-04-26 NOTE — Care Management Note (Signed)
Case Management Note  Patient Details  Name: Bethany Mccarthy MRN: [id number] Date of Birth: 06/25/1965  Subjective/Objective:                    Action/Plan:  Will provide [place] letter  On day of discharge ,and [facility] information  Expected Discharge Date:                  Expected Discharge Plan:  Home/Self Care  In-House Referral:     Discharge planning Services  CM Consult, [facility], [facility] Program, Medication Assistance  Post Acute Care Choice:    Choice offered to:     DME Arranged:    DME Agency:     HH Arranged:    HH Agency:     Status of Service:  In process, will continue to follow  Medicare Important Message Given:    Date Medicare IM Given:    Medicare IM give by:    Date Additional Medicare IM Given:    Additional Medicare Important Message give by:     If discussed at [place] of Stay Meetings, dates discussed:    Additional Comments:  Marilu Favre, RN 04/26/2015, 8:03 AM

## 2015-04-27 ENCOUNTER — Inpatient Hospital Stay (HOSPITAL_COMMUNITY): Payer: Self-pay

## 2015-04-27 DIAGNOSIS — F419 Anxiety disorder, unspecified: Secondary | ICD-10-CM

## 2015-04-27 DIAGNOSIS — G4700 Insomnia, unspecified: Secondary | ICD-10-CM

## 2015-04-27 DIAGNOSIS — E1165 Type 2 diabetes mellitus with hyperglycemia: Secondary | ICD-10-CM

## 2015-04-27 DIAGNOSIS — R739 Hyperglycemia, unspecified: Secondary | ICD-10-CM

## 2015-04-27 DIAGNOSIS — E118 Type 2 diabetes mellitus with unspecified complications: Secondary | ICD-10-CM

## 2015-04-27 DIAGNOSIS — N390 Urinary tract infection, site not specified: Secondary | ICD-10-CM

## 2015-04-27 DIAGNOSIS — A419 Sepsis, unspecified organism: Principal | ICD-10-CM

## 2015-04-27 DIAGNOSIS — N12 Tubulo-interstitial nephritis, not specified as acute or chronic: Secondary | ICD-10-CM

## 2015-04-27 LAB — GLUCOSE, CAPILLARY
GLUCOSE-CAPILLARY: 216 mg/dL — AB (ref 65–99)
GLUCOSE-CAPILLARY: 166 mg/dL — AB (ref 65–99)
Glucose-Capillary: 242 mg/dL — ABNORMAL HIGH (ref 65–99)
Glucose-Capillary: 217 mg/dL — ABNORMAL HIGH (ref 65–99)
Glucose-Capillary: 222 mg/dL — ABNORMAL HIGH (ref 65–99)

## 2015-04-27 MED ORDER — LORAZEPAM 2 MG/ML IJ SOLN
1.0000 mg | Freq: Once | INTRAMUSCULAR | Status: AC
  Administered 2015-04-27: 1 mg via INTRAVENOUS
  Filled 2015-04-27: qty 1

## 2015-04-27 MED ORDER — GABAPENTIN 100 MG PO CAPS
100.0000 mg | ORAL_CAPSULE | Freq: Two times a day (BID) | ORAL | Status: DC
  Administered 2015-04-27 (×2): 100 mg via ORAL
  Filled 2015-04-27 (×2): qty 1

## 2015-04-27 MED ORDER — INSULIN ASPART 100 UNIT/ML ~~LOC~~ SOLN
3.0000 [IU] | Freq: Three times a day (TID) | SUBCUTANEOUS | Status: DC
  Administered 2015-04-27 – 2015-04-28 (×3): 3 [IU] via SUBCUTANEOUS

## 2015-04-27 NOTE — Progress Notes (Addendum)
[facility]                                                                              Patient Demographics  Bethany Mccarthy, is a 49 y.o. female, DOB - Nov 05, 1965, ZWC:[id number]  Admit date - 04/25/2015   Admitting Physician Cristal Ford, DO  Outpatient Primary MD for the patient is No PCP Per Patient  LOS - 2   Chief Complaint  Patient presents with  . Back Pain  . Abdominal Pain  . Emesis       Brief HPI   Bethany Mccarthy is a 49 y.o. female with history of insomnia and anxiety who recently moved her from [place] who presented to the emergency department with back pain, abdominal pain, nausea and vomiting. She also complained of left shoulder and chest pain which had been intermittently going on for approximately 1 week. She endorsed dysuria and increased thirst. In the emergency department, she was found to have a urinary tract infection, CT showed bilateral pyelonephritis. She was septic with tachycardia and leukocytosis. Patient also had a blood sugar 473, lactic acid 2.78. [facility] called for admission.   Assessment & Plan    Sepsis secondary to pyelonephritis -patient met sepsis criteria on admission, patient did have leukocytosis with tachycardia, tachypnea, lactic acid of 2.78 -Ultrasound of the abdomen normal -CT pelvis with contrast: Bilateral pyelonephritis, right punctate mid to lower pole nephrolithiasis, no hydronephrosis -UA shows 21 and 50 WBC, many bacteria, positive nitrites -Follow urine culture and blood culture and sensitivities. Urine culture showing Escherichia coli, sensitivities pending -Continue IV Rocephin, pain control and antiemetics    Neck /left shoulder pain, likely due to cervical radiculopathy -  MRI of the C-spine and shows broad-based left paracentral protrusion, mild kyphosis at C5-C6, resulting in flattening of the cord worse on the left, mild to moderate foraminal narrowing at this level worse on the left,  shallow broad-based disc bulge at C5-C6 - Continue pain control, added Neurontin - Called neurosurgery consult discussed with Dr. Ronnald Ramp, recommended pain control at this time. Follow-up outpatient, once infection is cleared, will likely need cervical epidural injection.     New diagnosis of diabetes mellitus - A1c 10.1 - CBG still elevated, added meal coverage - Start lantus 10 units,  Continue low dose SSI  - Patient requesting oral hypoglycemic at the time of discharge. - Nutrition consult for diet education  Pseudohyponatremia/Hypochloremia/ Dehydration, resolving with IVF and correction of hyperglycemia - resolved with IVF   Hypokalemia, resolved with supplementation  Nausea and vomiting, likely secondary to sepsis/pyelonephritis - resolving, cont diabetic diet  Anxiety/insomnia -Continue Xanax QHS PRN - Start fluoxetine  Tobacco abuse -Discussed smoking cessation counseling - nicotine patch   Code Status: full code  Family Communication: Discussed in detail with the patient, all imaging results, lab results explained to the patient    Disposition Plan: Once culture results are available, likely DC in am  Time Spent in minutes   25  minutes  Procedures  MRI of the C-spine  CT abdomen and pelvis  Ultrasound abdomen  Consults   Neurosurgery  DVT Prophylaxis  Lovenox  Medications  Scheduled Meds: . cefTRIAXone (ROCEPHIN)  IV  1 g Intravenous Q24H  . enoxaparin (LOVENOX) injection  40 mg Subcutaneous Q24H  . FLUoxetine  10 mg Oral Daily  . gabapentin  100 mg Oral BID  . insulin aspart  0-9 Units Subcutaneous TID WC  . insulin glargine  10 Units Subcutaneous QHS  . nicotine  21 mg Transdermal Daily   Continuous Infusions: . sodium chloride 75 mL/hr at 04/27/15 0840   PRN Meds:.acetaminophen **OR** acetaminophen, ALPRAZolam, HYDROcodone-acetaminophen, morphine injection, ondansetron **OR** ondansetron (ZOFRAN) IV   Antibiotics   Anti-infectives     Start     Dose/Rate Route Frequency Ordered Stop   04/26/15 1730  fluconazole (DIFLUCAN) tablet 150 mg     150 mg Oral  Once 04/26/15 1725 04/26/15 1739   04/26/15 1630  cefTRIAXone (ROCEPHIN) 1 g in dextrose 5 % 50 mL IVPB     1 g 100 mL/hr over 30 Minutes Intravenous Every 24 hours 04/25/15 1652     04/25/15 1630  cefTRIAXone (ROCEPHIN) 1 g in dextrose 5 % 50 mL IVPB     1 g 100 mL/hr over 30 Minutes Intravenous  Once 04/25/15 1627 04/25/15 1707        Subjective:   Bethany Mccarthy was seen and examined today.  Still having lower quadrant abdominal pain, back pain, neck pain 5/10 radiating to the left shoulder and arm with tingling. Denies any chest pain, nausea, vomiting. Afebrile.    Objective:   Blood pressure 146/72, pulse 70, temperature 98.4 F (36.9 C), temperature source Oral, resp. rate 19, height 5' 6" (1.676 m), weight 63.5 kg (139 lb 15.9 oz), SpO2 99 %.  Wt Readings from Last 3 Encounters:  04/25/15 63.5 kg (139 lb 15.9 oz)     Intake/Output Summary (Last 24 hours) at 04/27/15 1233 Last data filed at 04/27/15 0500  Gross per 24 hour  Intake    598 ml  Output   2000 ml  Net  -1402 ml    Exam  General: Alert and oriented x 3, NAD  HEENT:  PERRLA, EOMI, Anicteric Sclera, mucous membranes moist.   Neck: Supple, no JVD, no masses  CVS: S1 S2 auscultated, no rubs, murmurs or gallops. Regular rate and rhythm.  Respiratory: Clear to auscultation bilaterally, no wheezing, rales or rhonchi  Abdomen: Soft, mild diffuse tenderness , nondistended, + bowel sounds  Ext: no cyanosis clubbing or edema  Neuro: AAOx3, Cr N's II- XII. Strength 5/5 upper and lower extremities bilaterally  Skin: No rashes  Psych: Normal affect and demeanor, alert and oriented x3    Data Review   Micro Results Recent Results (from the past 240 hour(s))  Urine culture     Status: None (Preliminary result)   Collection Time: 04/25/15 12:12 PM  Result Value Ref Range Status    Specimen Description URINE, CLEAN CATCH  Final   Special Requests NONE  Final   Culture   Final    >=100,000 COLONIES/mL ESCHERICHIA COLI 50,000 COLONIES/mL GRAM POSITIVE COCCI    Report Status PENDING  Incomplete  Culture, blood (x 2)     Status: None (Preliminary result)   Collection Time: 04/25/15  7:22 PM  Result Value Ref Range Status   Specimen Description BLOOD RIGHT ARM  Final   Special Requests BOTTLES DRAWN AEROBIC AND ANAEROBIC 5CC  Final   Culture NO GROWTH 2 DAYS  Final   Report Status PENDING  Incomplete  Culture, blood (x 2)     Status:  None (Preliminary result)   Collection Time: 04/25/15  7:31 PM  Result Value Ref Range Status   Specimen Description BLOOD RIGHT HAND  Final   Special Requests BOTTLES DRAWN AEROBIC ONLY 5CC  Final   Culture NO GROWTH 2 DAYS  Final   Report Status PENDING  Incomplete    Radiology Reports Mr Cervical Spine Wo Contrast  04/27/2015  CLINICAL DATA:  Neck pain with left shoulder and arm pain and tingling for 3 days. No known injury. Initial encounter. EXAM: MRI CERVICAL SPINE WITHOUT CONTRAST TECHNIQUE: Multiplanar, multisequence MR imaging of the cervical spine was performed. No intravenous contrast was administered. COMPARISON:  None. FINDINGS: There is mild kyphosis about the C5-6 level. Vertebral body height, signal and alignment are maintained. The craniocervical junction is normal and cervical cord signal is normal. Imaged paraspinous structures are unremarkable. C2-3:  Negative. C3-4:  Negative. C4-5: A left paracentral protrusion contacts and slightly deforms the ventral aspect of the left hemi cord. The foramina are widely patent. C5-6: Broad-based left paracentral protrusion and uncovertebral disease are seen. Combined with mild kyphosis, the disc results in flattening of the ventral cord. Mild to moderate foraminal narrowing is more notable on the left. C6-7: Shallow broad-based disc bulge results in some flattening of the ventral cord.  The foramina are widely patent. C7-T1:  Negative. IMPRESSION: Broad-based left paracentral protrusion and mild kyphosis at C5-6 result in flattening of the cord, worse on the left. Mild to moderate foraminal narrowing at this level is worse on the left. Shallow broad-based disc bulge at C5-6 results in some flattening of the ventral cord. Electronically Signed   By: Inge Rise M.D.   On: 04/27/2015 10:58   US Abdomen Complete  04/25/2015  CLINICAL DATA:  Right upper quadrant pain EXAM: ULTRASOUND ABDOMEN COMPLETE COMPARISON:  None. FINDINGS: Gallbladder: No gallstones or wall thickening visualized. No sonographic Murphy sign noted. Common bile duct: Diameter: Normal caliber, 3 mm Liver: No focal lesion identified. Within normal limits in parenchymal echogenicity. IVC: No abnormality visualized. Pancreas: Visualized portion unremarkable. Spleen: Size and appearance within normal limits. Right Kidney: Length: 13.3 cm. Echogenicity within normal limits. No mass or hydronephrosis visualized. Left Kidney: Length: 12.3 cm. Echogenicity within normal limits. No mass or hydronephrosis visualized. Abdominal aorta: No aneurysm visualized. Other findings: None. IMPRESSION: Normal study. Electronically Signed   By: Rolm Baptise M.D.   On: 04/25/2015 14:00   Ct Abdomen Pelvis W Contrast  04/25/2015  CLINICAL DATA:  Right lower quadrant and back pain. EXAM: CT ABDOMEN AND PELVIS WITH CONTRAST TECHNIQUE: Multidetector CT imaging of the abdomen and pelvis was performed using the standard protocol following bolus administration of intravenous contrast. CONTRAST:  157m OMNIPAQUE IOHEXOL 300 MG/ML  SOLN COMPARISON:  None. FINDINGS: Dependent atelectasis in the lung bases. No effusions. Heart is normal size. Liver, gallbladder, spleen, pancreas, right adrenal is unremarkable. Areas of poor enhancement noted in the kidneys bilaterally including mid and lower pole on the right and predominately lower pole on the left  compatible with pyelonephritis. No hydronephrosis. Small nonobstructing stone in the mid to lower pole of the right kidney. Appendix is visualized and is normal. Prior hysterectomy. No adnexal masses. Gas is noted in the urinary bladder presumably from recent catheterization. Recommend clinical correlation. Stomach, large and small bowel unremarkable. Aortic calcifications and atherosclerosis in the aorta and iliac vessels. No free fluid, free air or adenopathy. No acute bony abnormality or focal bone lesion. IMPRESSION: Areas of poor enhancement in the kidneys bilaterally  compatible with bilateral pyelonephritis. Right punctate mid to lower pole nephrolithiasis. No hydronephrosis. Aortic atherosclerosis, advanced for patient's age. Gas within the urinary bladder, presumably related to recent catheterization. Recommend clinical correlation. Electronically Signed   By: Rolm Baptise M.D.   On: 04/25/2015 16:06   Dg Chest Port 1 View  04/25/2015  CLINICAL DATA:  Left chest pain radiating into the left shoulder for 1 week. Initial encounter. EXAM: PORTABLE CHEST 1 VIEW COMPARISON:  None. FINDINGS: Heart size and mediastinal contours are within normal limits. Both lungs are clear. Visualized skeletal structures are unremarkable. IMPRESSION: Negative exam. Electronically Signed   By: Inge Rise M.D.   On: 04/25/2015 18:40    CBC  Recent Labs Lab 04/25/15 1057 04/26/15 0545  WBC 15.6* 9.6  HGB 14.5 12.1  HCT 40.2 35.0*  PLT 263 230  MCV 87.0 89.1  MCH 31.4 30.8  MCHC 36.1* 34.6  RDW 12.3 12.2    Chemistries   Recent Labs Lab 04/25/15 1057 04/26/15 0545  NA 129* 134*  K 3.4* 3.6  CL 91* 102  CO2 25 25  GLUCOSE 473* 216*  BUN 9 <5*  CREATININE 0.86 0.53  CALCIUM 9.7 8.2*  AST 18  --   ALT 14  --   ALKPHOS 100  --   BILITOT 0.6  --    ------------------------------------------------------------------------------------------------------------------ estimated creatinine clearance  is 82.3 mL/min (by C-G formula based on Cr of 0.53). ------------------------------------------------------------------------------------------------------------------  Recent Labs  04/25/15 1917  HGBA1C 10.1*   ------------------------------------------------------------------------------------------------------------------  Recent Labs  04/25/15 1917  CHOL 205*  HDL 15*  LDLCALC 139*  TRIG 254*  CHOLHDL 13.7   ------------------------------------------------------------------------------------------------------------------ No results for input(s): TSH, T4TOTAL, T3FREE, THYROIDAB in the last 72 hours.  Invalid input(s): FREET3 ------------------------------------------------------------------------------------------------------------------ No results for input(s): VITAMINB12, FOLATE, FERRITIN, TIBC, IRON, RETICCTPCT in the last 72 hours.  Coagulation profile No results for input(s): INR, PROTIME in the last 168 hours.  No results for input(s): DDIMER in the last 72 hours.  Cardiac Enzymes  Recent Labs Lab 04/25/15 1917 04/26/15 0104 04/26/15 0545  TROPONINI 0.03 <0.03 0.03   ------------------------------------------------------------------------------------------------------------------ Invalid input(s): POCBNP   Recent Labs  04/26/15 0753 04/26/15 1252 04/26/15 1655 04/26/15 2035 04/27/15 0737 04/27/15 1212  GLUCAP 217* 279* 215* 242* 216* 23*     RAI,RIPUDEEP M.D. [facility] 04/27/2015, 12:33 PM  Pager: [phone] Between 7am to 7pm - call Pager - [phone]  After 7pm go to [email or web address] - password [email or web address]  Call night coverage person covering after 7pm

## 2015-04-27 NOTE — Care Management Note (Signed)
Case Management Note  Patient Details  Name: Bethany Mccarthy MRN: [id number] Date of Birth: [birth year]  Subjective/Objective:                    Action/Plan:  Explained [place] letter and provided [employer] information to patient.   Patient needs  Glucometer, called [facility] OP spoke with Butch Penny  , they have free machines however test strips are $60, [employer] has  Best price on machines and test strips are $14 . Patient aware .   Expected Discharge Date:                  Expected Discharge Plan:  Home/Self Care  In-House Referral:     Discharge planning Services  CM Consult, [facility], [facility] Program, Medication Assistance  Post Acute Care Choice:    Choice offered to:  Patient  DME Arranged:    DME Agency:     HH Arranged:    [place] Agency:     Status of Service:  Completed, signed off  Medicare Important Message Given:    Date Medicare IM Given:    Medicare IM give by:    Date Additional Medicare IM Given:    Additional Medicare Important Message give by:     If discussed at [place] of Stay Meetings, dates discussed:    Additional Comments:  Marilu Favre, RN 04/27/2015, 2:13 PM

## 2015-04-27 NOTE — Plan of Care (Signed)
Problem: Food- and Nutrition-Related Knowledge Deficit (NB-1.1) Goal: Nutrition education Formal process to instruct or train a patient/client in a skill or to impart knowledge to help patients/clients voluntarily manage or modify food choices and eating behavior to maintain or improve health. Outcome: Adequate for Discharge  RD consulted for nutrition education regarding diabetes.     Lab Results  Component Value Date    HGBA1C 10.1* 04/25/2015   Spoke with pt to reinforce DM diet principles. She reports she watched the DM videos yesterday. She was able to express good recall of concepts discussed in video. RD reinforced diet principles and pt was very receptive. Pt concerned about discharge planning and able to access meter and medications at discharge; noted CM consult. Findings discussed with RN.   RD provided "Carbohydrate Counting for People with Diabetes" handout from the Academy of Nutrition and Dietetics. Discussed different food groups and their effects on blood sugar, emphasizing carbohydrate-containing foods. Provided list of carbohydrates and recommended serving sizes of common foods.  Discussed importance of controlled and consistent carbohydrate intake throughout the day. Provided examples of ways to balance meals/snacks and encouraged intake of high-fiber, whole grain complex carbohydrates. Teach back method used.  Expect fair to good compliance.  Body mass index is 22.61 kg/(m^2). Pt meets criteria for normal weight, based on current BMI.  Current diet order is Carb Modified, patient is consuming approximately 100% of meals at this time. Labs and medications reviewed. No further nutrition interventions warranted at this time. RD contact information provided. If additional nutrition issues arise, please re-consult RD.  Erial Fikes A. Jimmye Norman, RD, LDN, CDE Pager: [phone] After hours Pager: [phone]

## 2015-04-28 DIAGNOSIS — R079 Chest pain, unspecified: Secondary | ICD-10-CM

## 2015-04-28 LAB — URINE CULTURE

## 2015-04-28 LAB — GLUCOSE, CAPILLARY
Glucose-Capillary: 168 mg/dL — ABNORMAL HIGH (ref 65–99)
Glucose-Capillary: 182 mg/dL — ABNORMAL HIGH (ref 65–99)

## 2015-04-28 MED ORDER — NICOTINE 21 MG/24HR TD PT24: 21.0000 mg | MEDICATED_PATCH | Freq: Every day | TRANSDERMAL | Status: DC

## 2015-04-28 MED ORDER — CEPHALEXIN 500 MG PO CAPS
1000.0000 mg | ORAL_CAPSULE | Freq: Two times a day (BID) | ORAL | Status: DC
  Administered 2015-04-28: 1000 mg via ORAL
  Filled 2015-04-28: qty 2

## 2015-04-28 MED ORDER — INSULIN REGULAR HUMAN 100 UNIT/ML IJ SOLN: INTRAMUSCULAR | Status: AC

## 2015-04-28 MED ORDER — LIDOCAINE 5 % EX PTCH
1.0000 | MEDICATED_PATCH | CUTANEOUS | Status: DC
  Administered 2015-04-28: 1 via TRANSDERMAL
  Filled 2015-04-28: qty 1

## 2015-04-28 MED ORDER — PRAVASTATIN SODIUM 10 MG PO TABS: 10.0000 mg | ORAL_TABLET | Freq: Every day | ORAL | Status: DC

## 2015-04-28 MED ORDER — FLUOXETINE HCL 10 MG PO CAPS: 10.0000 mg | ORAL_CAPSULE | Freq: Every day | ORAL | Status: DC

## 2015-04-28 MED ORDER — HYDROCODONE-ACETAMINOPHEN 5-325 MG PO TABS: 1.0000 | ORAL_TABLET | ORAL | Status: DC | PRN

## 2015-04-28 MED ORDER — ALPRAZOLAM 0.25 MG PO TABS: 0.2500 mg | ORAL_TABLET | Freq: Every evening | ORAL | Status: AC | PRN

## 2015-04-28 MED ORDER — GLIMEPIRIDE 2 MG PO TABS: 2.0000 mg | ORAL_TABLET | Freq: Every day | ORAL | Status: DC

## 2015-04-28 MED ORDER — GABAPENTIN 300 MG PO CAPS: 300.0000 mg | ORAL_CAPSULE | Freq: Two times a day (BID) | ORAL | Status: DC

## 2015-04-28 MED ORDER — LIDOCAINE 5 % EX PTCH: 1.0000 | MEDICATED_PATCH | CUTANEOUS | Status: AC

## 2015-04-28 MED ORDER — INSULIN SYRINGES (DISPOSABLE) U-100 0.5 ML MISC: Status: AC

## 2015-04-28 MED ORDER — METFORMIN HCL 1000 MG PO TABS: 1000.0000 mg | ORAL_TABLET | Freq: Two times a day (BID) | ORAL | Status: DC

## 2015-04-28 MED ORDER — CEPHALEXIN 500 MG PO CAPS: 1000.0000 mg | ORAL_CAPSULE | Freq: Two times a day (BID) | ORAL | Status: DC

## 2015-04-28 MED ORDER — PROMETHAZINE HCL 12.5 MG PO TABS: 12.5000 mg | ORAL_TABLET | Freq: Four times a day (QID) | ORAL | Status: DC | PRN

## 2015-04-28 MED ORDER — PREDNISONE 10 MG (21) PO TBPK: 10.0000 mg | ORAL_TABLET | Freq: Every day | ORAL | Status: DC

## 2015-04-28 MED ORDER — PREDNISONE 50 MG PO TABS
60.0000 mg | ORAL_TABLET | Freq: Every day | ORAL | Status: DC
  Administered 2015-04-28: 60 mg via ORAL
  Filled 2015-04-28 (×2): qty 1

## 2015-04-28 MED ORDER — TRAMADOL HCL 50 MG PO TABS: 50.0000 mg | ORAL_TABLET | Freq: Four times a day (QID) | ORAL | Status: DC | PRN

## 2015-04-28 MED ORDER — INSULIN GLARGINE 100 UNIT/ML ~~LOC~~ SOLN
14.0000 [IU] | Freq: Every day | SUBCUTANEOUS | Status: DC
  Filled 2015-04-28: qty 0.14

## 2015-04-28 MED ORDER — INSULIN STARTER KIT- SYRINGES (ENGLISH)
1.0000 | Freq: Once | Status: AC
  Administered 2015-04-28: 1
  Filled 2015-04-28: qty 1

## 2015-04-28 MED ORDER — INSULIN STARTER KIT- SYRINGES (ENGLISH): 1.0000 | Freq: Once | Status: AC

## 2015-04-28 MED ORDER — BLOOD GLUCOSE MONITOR KIT: PACK | Status: AC

## 2015-04-28 MED ORDER — GABAPENTIN 300 MG PO CAPS
300.0000 mg | ORAL_CAPSULE | Freq: Two times a day (BID) | ORAL | Status: DC
  Administered 2015-04-28: 300 mg via ORAL
  Filled 2015-04-28: qty 1

## 2015-04-28 NOTE — Progress Notes (Signed)
Inpatient Diabetes Program Recommendations  AACE/ADA: New Consensus Statement on Inpatient Glycemic Control (2015)  Target Ranges:  Prepandial:   less than 140 mg/dL      Peak postprandial:   less than 180 mg/dL (1-2 hours)      Critically ill patients:  140 - 180 mg/dL   Review of Glycemic Control:  Results for RENETTA, SUMAN (MRN [id number]) as of 04/28/2015 10:12  Ref. Range 04/27/2015 12:12 04/27/2015 16:44 04/27/2015 21:49 04/28/2015 07:53  Glucose-Capillary Latest Ref Range: 65-99 mg/dL 217 (H) 222 (H) 166 (H) 168 (H)   Diabetes history: New onset diabetes  Spoke with Dr. Tana Coast regarding discharge plans for patient.  She will d/c patient on oral agents metformin and Amaryl which are both available on the (4$) list from [facility].  Patient will also be on 5 day steroid dose pack and therefore blood sugars will likely increase.  Recommend Novolin R- Correction scale before meals and at bedtime per moderate correction scale. Correction scale prior to meals three times a day 121-150- 2 units 151-200- 3 units 201-250- 5 units 251-300- 8 units 301-350- 11 units 351-400- 15 units > 400- 15 units and call MD  Bedtime correction scale: 201-250-2 units 251-300-3 units 301-350-4 units 351-400-5 units >400- 5 units and call MD  Discussed at length with patient.  Also called [facility] regarding availability of medications/ meter.  It may still be best for patient to get medications from [facility] for diabetes.  She states she has already practiced administration of insulin and drawing insulin up.  Ordered insulin starter kit.  Explained plan of correction Regular insulin at discharge until she follows up at [facility] on November 21.  Will follow.  Thanks, Adah Perl, RN, BC-ADM Inpatient Diabetes Coordinator Pager [phone] (8a-5p)

## 2015-04-28 NOTE — Progress Notes (Signed)
Servando Snare to be D/C'd home per MD order. Discussed with the patient and all questions fully answered.  VSS, Skin clean, dry and intact without evidence of skin break down, no evidence of skin tears noted.  IV catheter discontinued intact. Site without signs and symptoms of complications. Dressing and pressure applied.  An After Visit Summary was printed and given to the patient. Patient received prescriptions and insulin starter kit.  Went over extensively how to check CBG, use sliding scale, draw up, and administer insulin.  Patient able to verbalize and demonstrate understanding.  Appointment [facility] at [facility] for [facility] up.  D/c education completed with patient/family including follow up instructions, medication list, d/c activities limitations if indicated, with other d/c instructions as indicated by MD - patient able to verbalize understanding, all questions fully answered.   Patient instructed to return to ED, call 911, or call MD for any changes in condition.   Patient to be escorted via [place], and D/C home via private auto.

## 2015-04-28 NOTE — Discharge Summary (Signed)
Physician Discharge Summary   Patient ID: Bethany Mccarthy MRN: [id number] DOB/AGE: 26-Apr-1966 49 y.o.  Admit date: 04/25/2015 Discharge date: 04/28/2015  Primary Care Physician:  No PCP Per Patient  Discharge Diagnoses:    . Pyelonephritis . Sepsis secondary to UTI, Escherichia coli UTI   New onset diabetes mellitus Hyperlipidemia  Cervical radiculopathy, C5-C6  Consults:   Neurosurgery, Dr. Sherley Bounds Diabetic coordinator Nutrition consult   Recommendations for Outpatient Follow-up:  Patient started on metformin 1000 mg twice a day and Amaryl 2 mg daily  Patient is also placed on prednisone Dosepak for her cervical radiculopathy, expecting her blood sugars to be high, patient is started on Novolin sliding scale insulin while she is on steroids  TESTS THAT NEED FOLLOW-UP Hemoglobin A1c in 3 months   DIET: Carb modified diet    Allergies:   Allergies  Allergen Reactions  . Levaquin [Levofloxacin In D5w] Hives  . Sulfa Antibiotics Hives     Discharge Medications:   Medication List    TAKE these medications        ALPRAZolam 0.25 MG tablet  Commonly known as:  XANAX  Take 1 tablet (0.25 mg total) by mouth at bedtime as needed (anxiety).     blood glucose meter kit and supplies Kit  Dispense based on patient and insurance preference. Use up to four times daily as directed. (FOR ICD-9 250.00, 250.01).     cephALEXin 500 MG capsule  Commonly known as:  KEFLEX  Take 2 capsules (1,000 mg total) by mouth 2 (two) times daily. X 10days     FLUoxetine 10 MG capsule  Commonly known as:  PROZAC  Take 1 capsule (10 mg total) by mouth daily.     gabapentin 300 MG capsule  Commonly known as:  NEURONTIN  Take 1 capsule (300 mg total) by mouth 2 (two) times daily.     glimepiride 2 MG tablet  Commonly known as:  AMARYL  Take 1 tablet (2 mg total) by mouth daily with breakfast.     HYDROcodone-acetaminophen 5-325 MG tablet  Commonly known as:  NORCO/VICODIN   Take 1-2 tablets by mouth every 4 (four) hours as needed for moderate pain or severe pain.     insulin regular 100 units/mL injection  Commonly known as:  NOVOLIN R  Correction scale prior to meals three times a day 121-150- 2 units,  151-200- 3 units,  201-250- 5 units,  251-300- 8 units,  301-350- 11 units, 351-400- 15 units, > 400- 15 units and call MD  Bedtime correction scale: 201-250-2 units,  251-300-3 units,  301-350-4 units, 351-400-5 units, >400- 5 units and call MD     insulin starter kit- syringes Misc  1 kit by Other route once.     Insulin Syringes (Disposable) U-100 0.5 ML Misc  Type 2 diabetes mellitus     lidocaine 5 %  Commonly known as:  LIDODERM  Place 1 patch onto the skin daily. Remove & Discard patch within 12 hours or as directed by MD     metFORMIN 1000 MG tablet  Commonly known as:  GLUCOPHAGE  Take 1 tablet (1,000 mg total) by mouth 2 (two) times daily with a meal.     nicotine 21 mg/24hr patch  Commonly known as:  NICODERM CQ - dosed in mg/24 hours  Place 1 patch (21 mg total) onto the skin daily.     pravastatin 10 MG tablet  Commonly known as:  PRAVACHOL  Take 1 tablet (10 mg total)  by mouth at bedtime.     predniSONE 10 MG (21) Tbpk tablet  Commonly known as:  STERAPRED UNI-PAK 21 TAB  Take 1 tablet (10 mg total) by mouth daily. Take as directed on the pack     promethazine 12.5 MG tablet  Commonly known as:  PHENERGAN  Take 1 tablet (12.5 mg total) by mouth every 6 (six) hours as needed for nausea or vomiting.         Brief H and P: For complete details please refer to admission H and P, but in brief the patient is a 49 year old female with anxiety, insomnia, recently moved from [place] presented to ED with back pain, abdominal pain, nausea and vomiting. She also reported left shoulder and chest pain, intermittently for one week. She endorsed dysuria and increased thirst. In ED patient was found to have UTI, CT showed bilateral  pyelonephritis. Patient met sepsis criteria with tachycardia, leukocytosis, UTI. She was also found to have blood sugar of 473 with lactic acid of 2.78 tried hospitalist service was requested for admission.  Hospital Course:   SIRS/sepsis secondary to pyelonephritis, Escherichia coli UTI -patient met sepsis criteria on admission, patient did have leukocytosis with tachycardia, tachypnea, lactic acid of 2.78 -Ultrasound of the abdomen normal -CT pelvis with contrast showed Bilateral pyelonephritis, right punctate mid to lower pole nephrolithiasis, no hydronephrosis -Blood cultures remain negative. Patient was placed on IV Rocephin. Urine culture showed Escherichia coli sensitive to IV Rocephin and Ancef. It was sensitive to ciprofloxacin however patient had prior history of hives with Levaquin and did not remember if she had ever taken ciprofloxacin. Hence patient was placed on Keflex for 10 days given bilateral pyelonephritis, she has received 3 days of IV antibiotics while inpatient.   Neck /left shoulder pain, likely due to cervical radiculopathy - MRI of the C-spine and shows broad-based left paracentral protrusion, mild kyphosis at C5-C6, resulting in flattening of the cord worse on the left, mild to moderate foraminal narrowing at this level worse on the left, shallow broad-based disc bulge at C5-C6 - Continue pain control, added Neurontin, increased to 300 mg twice a day  - Neurosurgery was consulted, discussed in detail with Dr. Sherley Bounds, recommended pain control at this time. Follow-up outpatient, once infection is cleared, will likely need cervical epidural injection. Follow-up with Dr. Jenny Reichmann schedule outpatient. Also placed on Sterapred pack.  New diagnosis of diabetes mellitus - A1c 10.1. Patient was placed on Lantus and sliding scale insulin while inpatient. Diabetic coordinator consult, nutrition consult for obtained. Patient was placed on metformin and Amaryl at discharge and  Novolin sliding scale while patient is on steroids.   Pseudohyponatremia/Hypochloremia/ Dehydration,  -  Resolved with IVF and correction of hyperglycemia  Hypokalemia, resolved with supplementation  Nausea and vomiting, likely secondary to sepsis/pyelonephritis - resolving, cont diabetic diet  Anxiety/insomnia -Continue Xanax QHS PRN - Start fluoxetine  Tobacco abuse -Discussed smoking cessation counseling - nicotine patch   Day of Discharge BP 127/72 mmHg  Pulse 59  Temp(Src) 98.2 F (36.8 C) (Oral)  Resp 17  Ht _0  (1.676 m)  Wt 63.5 kg (139 lb 15.9 oz)  BMI 22.61 kg/m2  SpO2 97%  Physical Exam: General: Alert and awake oriented x3 not in any acute distress. HEENT: anicteric sclera, pupils reactive to light and accommodation CVS: S1-S2 clear no murmur rubs or gallops Chest: clear to auscultation bilaterally, no wheezing rales or rhonchi Abdomen: soft nontender, nondistended, normal bowel sounds Extremities: no cyanosis, clubbing or  edema noted bilaterally    The results of significant diagnostics from this hospitalization (including imaging, microbiology, ancillary and laboratory) are listed below for reference.    LAB RESULTS: Basic Metabolic Panel:  Recent Labs Lab 04/25/15 1057 04/26/15 0545  NA 129* 134*  K 3.4* 3.6  CL 91* 102  CO2 25 25  GLUCOSE 473* 216*  BUN 9 <5*  CREATININE 0.86 0.53  CALCIUM 9.7 8.2*   Liver Function Tests:  Recent Labs Lab 04/25/15 1057  AST 18  ALT 14  ALKPHOS 100  BILITOT 0.6  PROT 7.2  ALBUMIN 3.3*    Recent Labs Lab 04/25/15 1057  LIPASE 71*   No results for input(s): AMMONIA in the last 168 hours. CBC:  Recent Labs Lab 04/25/15 1057 04/26/15 0545  WBC 15.6* 9.6  HGB 14.5 12.1  HCT 40.2 35.0*  MCV 87.0 89.1  PLT 263 230   Cardiac Enzymes:  Recent Labs Lab 04/26/15 0104 04/26/15 0545  TROPONINI <0.03 0.03   BNP: Invalid input(s): POCBNP CBG:  Recent Labs Lab 04/28/15 0753  04/28/15 1159  GLUCAP 168* 182*    Significant Diagnostic Studies:  US Abdomen Complete  04/25/2015  CLINICAL DATA:  Right upper quadrant pain EXAM: ULTRASOUND ABDOMEN COMPLETE COMPARISON:  None. FINDINGS: Gallbladder: No gallstones or wall thickening visualized. No sonographic Murphy sign noted. Common bile duct: Diameter: Normal caliber, 3 mm Liver: No focal lesion identified. Within normal limits in parenchymal echogenicity. IVC: No abnormality visualized. Pancreas: Visualized portion unremarkable. Spleen: Size and appearance within normal limits. Right Kidney: Length: 13.3 cm. Echogenicity within normal limits. No mass or hydronephrosis visualized. Left Kidney: Length: 12.3 cm. Echogenicity within normal limits. No mass or hydronephrosis visualized. Abdominal aorta: No aneurysm visualized. Other findings: None. IMPRESSION: Normal study. Electronically Signed   By: Rolm Baptise M.D.   On: 04/25/2015 14:00   Ct Abdomen Pelvis W Contrast  04/25/2015  CLINICAL DATA:  Right lower quadrant and back pain. EXAM: CT ABDOMEN AND PELVIS WITH CONTRAST TECHNIQUE: Multidetector CT imaging of the abdomen and pelvis was performed using the standard protocol following bolus administration of intravenous contrast. CONTRAST:  146m OMNIPAQUE IOHEXOL 300 MG/ML  SOLN COMPARISON:  None. FINDINGS: Dependent atelectasis in the lung bases. No effusions. Heart is normal size. Liver, gallbladder, spleen, pancreas, right adrenal is unremarkable. Areas of poor enhancement noted in the kidneys bilaterally including mid and lower pole on the right and predominately lower pole on the left compatible with pyelonephritis. No hydronephrosis. Small nonobstructing stone in the mid to lower pole of the right kidney. Appendix is visualized and is normal. Prior hysterectomy. No adnexal masses. Gas is noted in the urinary bladder presumably from recent catheterization. Recommend clinical correlation. Stomach, large and small bowel  unremarkable. Aortic calcifications and atherosclerosis in the aorta and iliac vessels. No free fluid, free air or adenopathy. No acute bony abnormality or focal bone lesion. IMPRESSION: Areas of poor enhancement in the kidneys bilaterally compatible with bilateral pyelonephritis. Right punctate mid to lower pole nephrolithiasis. No hydronephrosis. Aortic atherosclerosis, advanced for patient's age. Gas within the urinary bladder, presumably related to recent catheterization. Recommend clinical correlation. Electronically Signed   By: KRolm BaptiseM.D.   On: 04/25/2015 16:06   Dg Chest Port 1 View  04/25/2015  CLINICAL DATA:  Left chest pain radiating into the left shoulder for 1 week. Initial encounter. EXAM: PORTABLE CHEST 1 VIEW COMPARISON:  None. FINDINGS: Heart size and mediastinal contours are within normal limits. Both lungs are clear. Visualized  skeletal structures are unremarkable. IMPRESSION: Negative exam. Electronically Signed   By: Inge Rise M.D.   On: 04/25/2015 18:40    2D ECHO:   Disposition and Follow-up: Discharge Instructions    Diet Carb Modified    Complete by:  As directed      Discharge instructions    Complete by:  As directed   Correction scale prior to meals three times a day 121-150- 2 units 151-200- 3 units 201-250- 5 units 251-300- 8 units 301-350- 11 units 351-400- 15 units > 400- 15 units and call MD  Bedtime correction scale: 201-250-2 units 251-300-3 units 301-350-4 units 351-400-5 units >400- 5 units and call MD    It is VERY IMPORTANT that you follow up with a PCP on a regular basis.  Check your blood glucoses before each meal and at bedtime and maintain a log of your readings.  Bring this log with you when you follow up with your PCP so that he or she can adjust your insulin at your follow up visit.     Increase activity slowly    Complete by:  As directed             DISPOSITION:  Home   DISCHARGE FOLLOW-UP Follow-up Information     Follow up with [facility].   Why:  Appointment May 09, 2015 at 1130 , for hospital follow-up. Please bring the log of your blood sugars to your follow-up appointment.   Contact information:   [address] [place] [address] [phone]      Follow up with Eustace Moore, MD On 05/10/2015.   Specialty:  Neurosurgery   Why:  at 10:45AM, for hospital follow-up, neurosurgery    Contact information:   [address] [place] [address] [place] [address] [phone]        Time spent on Discharge:  35 minutes  Signed:   RAI,RIPUDEEP M.D. [facility] 04/28/2015, 12:03 PM Pager: [phone]

## 2015-04-30 LAB — CULTURE, BLOOD (ROUTINE X 2)
CULTURE: NO GROWTH
CULTURE: NO GROWTH

## 2015-05-09 ENCOUNTER — Inpatient Hospital Stay: Payer: Self-pay | Admitting: Family Medicine

## 2015-05-31 ENCOUNTER — Encounter: Payer: Self-pay | Admitting: Family Medicine

## 2015-05-31 ENCOUNTER — Ambulatory Visit: Payer: Self-pay | Attending: Family Medicine | Admitting: Family Medicine

## 2015-05-31 VITALS — BP 112/76 | HR 78 | Temp 98.2°F | Resp 14 | Ht 66.0 in | Wt 138.6 lb

## 2015-05-31 DIAGNOSIS — M50222 Other cervical disc displacement at C5-C6 level: Secondary | ICD-10-CM | POA: Insufficient documentation

## 2015-05-31 DIAGNOSIS — E1165 Type 2 diabetes mellitus with hyperglycemia: Secondary | ICD-10-CM | POA: Insufficient documentation

## 2015-05-31 DIAGNOSIS — Z794 Long term (current) use of insulin: Secondary | ICD-10-CM | POA: Insufficient documentation

## 2015-05-31 DIAGNOSIS — R112 Nausea with vomiting, unspecified: Secondary | ICD-10-CM | POA: Insufficient documentation

## 2015-05-31 DIAGNOSIS — R109 Unspecified abdominal pain: Secondary | ICD-10-CM | POA: Insufficient documentation

## 2015-05-31 DIAGNOSIS — E7800 Pure hypercholesterolemia, unspecified: Secondary | ICD-10-CM | POA: Insufficient documentation

## 2015-05-31 DIAGNOSIS — Z7984 Long term (current) use of oral hypoglycemic drugs: Secondary | ICD-10-CM | POA: Insufficient documentation

## 2015-05-31 DIAGNOSIS — M5010 Cervical disc disorder with radiculopathy, unspecified cervical region: Secondary | ICD-10-CM | POA: Insufficient documentation

## 2015-05-31 DIAGNOSIS — G4700 Insomnia, unspecified: Secondary | ICD-10-CM | POA: Insufficient documentation

## 2015-05-31 DIAGNOSIS — F329 Major depressive disorder, single episode, unspecified: Secondary | ICD-10-CM | POA: Insufficient documentation

## 2015-05-31 DIAGNOSIS — M25512 Pain in left shoulder: Secondary | ICD-10-CM | POA: Insufficient documentation

## 2015-05-31 DIAGNOSIS — F32A Depression, unspecified: Secondary | ICD-10-CM | POA: Insufficient documentation

## 2015-05-31 LAB — GLUCOSE, POCT (MANUAL RESULT ENTRY): POC GLUCOSE: 277 mg/dL — AB (ref 70–99)

## 2015-05-31 MED ORDER — TRUEPLUS LANCETS 28G MISC: 1.0000 | Freq: Three times a day (TID) | Status: AC

## 2015-05-31 MED ORDER — GABAPENTIN 300 MG PO CAPS: 300.0000 mg | ORAL_CAPSULE | Freq: Two times a day (BID) | ORAL | Status: DC

## 2015-05-31 MED ORDER — ACETAMINOPHEN-CODEINE #3 300-30 MG PO TABS: 1.0000 | ORAL_TABLET | Freq: Three times a day (TID) | ORAL | Status: DC | PRN

## 2015-05-31 MED ORDER — TRAZODONE HCL 100 MG PO TABS: 100.0000 mg | ORAL_TABLET | Freq: Every evening | ORAL | Status: DC | PRN

## 2015-05-31 MED ORDER — PRAVASTATIN SODIUM 10 MG PO TABS: 10.0000 mg | ORAL_TABLET | Freq: Every day | ORAL | Status: DC

## 2015-05-31 MED ORDER — KETOROLAC TROMETHAMINE 60 MG/2ML IM SOLN
60.0000 mg | Freq: Once | INTRAMUSCULAR | Status: AC
  Administered 2015-05-31: 60 mg via INTRAMUSCULAR

## 2015-05-31 MED ORDER — METFORMIN HCL 1000 MG PO TABS: 1000.0000 mg | ORAL_TABLET | Freq: Two times a day (BID) | ORAL | Status: DC

## 2015-05-31 MED ORDER — TRUE METRIX METER DEVI: 1.0000 | Freq: Three times a day (TID) | Status: AC

## 2015-05-31 MED ORDER — GLIMEPIRIDE 4 MG PO TABS: 4.0000 mg | ORAL_TABLET | Freq: Every day | ORAL | Status: DC

## 2015-05-31 MED ORDER — GLUCOSE BLOOD VI STRP
ORAL_STRIP | Status: AC
Start: 2015-05-31 — End: ?

## 2015-05-31 MED ORDER — FLUOXETINE HCL 10 MG PO CAPS: 10.0000 mg | ORAL_CAPSULE | Freq: Every day | ORAL | Status: DC

## 2015-05-31 NOTE — Patient Instructions (Signed)
Diabetes Mellitus and Food It is important for you to manage your blood sugar (glucose) level. Your blood glucose level can be greatly affected by what you eat. Eating healthier foods in the appropriate amounts throughout the day at about the same time each day will help you control your blood glucose level. It can also help slow or prevent worsening of your diabetes mellitus. Healthy eating may even help you improve the level of your blood pressure and reach or maintain a healthy weight.  General recommendations for healthful eating and cooking habits include:  Eating meals and snacks regularly. Avoid going long periods of time without eating to lose weight.  Eating a diet that consists mainly of plant-based foods, such as fruits, vegetables, nuts, legumes, and whole grains.  Using low-heat cooking methods, such as baking, instead of high-heat cooking methods, such as deep frying. Work with your dietitian to make sure you understand how to use the Nutrition Facts information on food labels. HOW CAN FOOD AFFECT ME? Carbohydrates Carbohydrates affect your blood glucose level more than any other type of food. Your dietitian will help you determine how many carbohydrates to eat at each meal and teach you how to count carbohydrates. Counting carbohydrates is important to keep your blood glucose at a healthy level, especially if you are using insulin or taking certain medicines for diabetes mellitus. Alcohol Alcohol can cause sudden decreases in blood glucose (hypoglycemia), especially if you use insulin or take certain medicines for diabetes mellitus. Hypoglycemia can be a life-threatening condition. Symptoms of hypoglycemia (sleepiness, dizziness, and disorientation) are similar to symptoms of having too much alcohol.  If your health care provider has given you approval to drink alcohol, do so in moderation and use the following guidelines:  Women should not have more than one drink per day, and men  should not have more than two drinks per day. One drink is equal to:  12 oz of beer.  5 oz of wine.  1 oz of hard liquor.  Do not drink on an empty stomach.  Keep yourself hydrated. Have water, diet soda, or unsweetened iced tea.  Regular soda, juice, and other mixers might contain a lot of carbohydrates and should be counted. WHAT FOODS ARE NOT RECOMMENDED? As you make food choices, it is important to remember that all foods are not the same. Some foods have fewer nutrients per serving than other foods, even though they might have the same number of calories or carbohydrates. It is difficult to get your body what it needs when you eat foods with fewer nutrients. Examples of foods that you should avoid that are high in calories and carbohydrates but low in nutrients include:  Trans fats (most processed foods list trans fats on the Nutrition Facts label).  Regular soda.  Juice.  Candy.  Sweets, such as cake, pie, doughnuts, and cookies.  Fried foods. WHAT FOODS CAN I EAT? Eat nutrient-rich foods, which will nourish your body and keep you healthy. The food you should eat also will depend on several factors, including:  The calories you need.  The medicines you take.  Your weight.  Your blood glucose level.  Your blood pressure level.  Your cholesterol level. You should eat a variety of foods, including:  Protein.  Lean cuts of meat.  Proteins low in saturated fats, such as fish, egg whites, and beans. Avoid processed meats.  Fruits and vegetables.  Fruits and vegetables that may help control blood glucose levels, such as apples, mangoes, and   yams.  Dairy products.  Choose fat-free or low-fat dairy products, such as milk, yogurt, and cheese.  Grains, bread, pasta, and rice.  Choose whole grain products, such as multigrain bread, whole oats, and brown rice. These foods may help control blood pressure.  Fats.  Foods containing healthful fats, such as nuts,  avocado, olive oil, canola oil, and fish. DOES EVERYONE WITH DIABETES MELLITUS HAVE THE SAME MEAL PLAN? Because every person with diabetes mellitus is different, there is not one meal plan that works for everyone. It is very important that you meet with a dietitian who will help you create a meal plan that is just right for you.   This information is not intended to replace advice given to you by your health care provider. Make sure you discuss any questions you have with your health care provider.   Document Released: 03/01/2005 Document Revised: 06/25/2014 Document Reviewed: 05/01/2013 Elsevier Interactive Patient Education 2016 [employer] Inc.  

## 2015-05-31 NOTE — Progress Notes (Signed)
Patient here as HFU She states she needs a glucose meter and supplies She is asking for refill on Xanax and her fluoxetine-policy discussed with patient regarding no refills on Xanax and [facility] referral She had a complete hysterectomy at 49 yo Needs [place] discount Asking for pain medication for shoulder pain that she rates 10/10

## 2015-05-31 NOTE — Progress Notes (Signed)
Subjective:  Patient ID: Bethany Mccarthy, female    DOB: 1965-07-27  Age: 49 y.o. MRN: [id number]  CC: Hospitalization Follow-up   HPI Bethany Mccarthy is a 49 year old with newly diagnosed type 2 diabetes mellitus (A1c 10.1), hyperlipidemia, C5-C6 cervical radiculopathy recently hospitalized at [facility] ED in 04/2015 for pyelonephritis after she had presented with back pain, abdominal pain and nausea and vomiting and CT scan was in keeping with pyelonephritis. She completed a course of therapy with IV antibiotics. She had also complained of neck pain and left shoulder pain for which MRI of the spine revealed disc bulge at C5-C6; she was seen by neurosurgery and was placed on Neurontin, pain control and prednisone with recommendations for cervical epidural injections once infection has cleared. During her hospitalization she was also diagnosed with type 2 diabetes mellitus with an A1c of 10.1 and was started on metformin, glipizide, NovoLog sliding scale and was also seen by the diabetic coordinator.  Today she complains of severe pain in her neck which radiates to her left and describes her left arm is feeling "dead". She has been compliant with her medications and has no urinary symptoms at this time  Outpatient Prescriptions Prior to Visit  Medication Sig Dispense Refill  . ALPRAZolam (XANAX) 0.25 MG tablet Take 1 tablet (0.25 mg total) by mouth at bedtime as needed (anxiety). 30 tablet 0  . insulin regular (NOVOLIN R) 100 units/mL injection Correction scale prior to meals three times a day 121-150- 2 units,  151-200- 3 units,  201-250- 5 units,  251-300- 8 units,  301-350- 11 units, 351-400- 15 units, > 400- 15 units and call MD  Bedtime correction scale: 201-250-2 units,  251-300-3 units,  301-350-4 units, 351-400-5 units, >400- 5 units and call MD 10 mL 11  . insulin starter kit- syringes MISC 1 kit by Other route once. 1 kit 0  . Insulin Syringes, Disposable, U-100 0.5 ML MISC Type 2  diabetes mellitus 100 each 1  . lidocaine (LIDODERM) 5 % Place 1 patch onto the skin daily. Remove & Discard patch within 12 hours or as directed by MD 30 patch 1  . promethazine (PHENERGAN) 12.5 MG tablet Take 1 tablet (12.5 mg total) by mouth every 6 (six) hours as needed for nausea or vomiting. 30 tablet 0  . FLUoxetine (PROZAC) 10 MG capsule Take 1 capsule (10 mg total) by mouth daily. 30 capsule 0  . gabapentin (NEURONTIN) 300 MG capsule Take 1 capsule (300 mg total) by mouth 2 (two) times daily. 60 capsule 3  . glimepiride (AMARYL) 2 MG tablet Take 1 tablet (2 mg total) by mouth daily with breakfast. 30 tablet 2  . metFORMIN (GLUCOPHAGE) 1000 MG tablet Take 1 tablet (1,000 mg total) by mouth 2 (two) times daily with a meal. 60 tablet 2  . pravastatin (PRAVACHOL) 10 MG tablet Take 1 tablet (10 mg total) by mouth at bedtime. 30 tablet 3  . blood glucose meter kit and supplies KIT Dispense based on patient and insurance preference. Use up to four times daily as directed. (FOR ICD-9 250.00, 250.01). (Patient not taking: Reported on 05/31/2015) 1 each 0  . cephALEXin (KEFLEX) 500 MG capsule Take 2 capsules (1,000 mg total) by mouth 2 (two) times daily. X 10days (Patient not taking: Reported on 05/31/2015) 40 capsule 0  . HYDROcodone-acetaminophen (NORCO/VICODIN) 5-325 MG tablet Take 1-2 tablets by mouth every 4 (four) hours as needed for moderate pain or severe pain. (Patient not taking: Reported on 05/31/2015) 30  tablet 0  . nicotine (NICODERM CQ - DOSED IN MG/24 HOURS) 21 mg/24hr patch Place 1 patch (21 mg total) onto the skin daily. (Patient not taking: Reported on 05/31/2015) 28 patch 2  . predniSONE (STERAPRED UNI-PAK 21 TAB) 10 MG (21) TBPK tablet Take 1 tablet (10 mg total) by mouth daily. Take as directed on the pack (Patient not taking: Reported on 05/31/2015) 21 tablet 0   No facility-administered medications prior to visit.    ROS Review of Systems  Constitutional: Negative for  activity change, appetite change and fatigue.  HENT: Negative for congestion, sinus pressure and sore throat.   Eyes: Negative for visual disturbance.  Respiratory: Negative for cough, chest tightness, shortness of breath and wheezing.   Cardiovascular: Negative for chest pain and palpitations.  Gastrointestinal: Negative for abdominal pain, constipation and abdominal distention.  Endocrine: Negative for polydipsia.  Genitourinary: Negative for dysuria and frequency.  Musculoskeletal: Positive for back pain and neck pain. Negative for arthralgias.  Skin: Negative for rash.  Neurological: Positive for numbness. Negative for tremors and light-headedness.  Hematological: Does not bruise/bleed easily.  Psychiatric/Behavioral: Negative for behavioral problems and agitation.    Objective:  BP 112/76 mmHg  Pulse 78  Temp(Src) 98.2 F (36.8 C)  Resp 14  Ht 5' 6"  (1.676 m)  Wt 138 lb 9.6 oz (62.869 kg)  BMI 22.38 kg/m2  SpO2 99%  BP/Weight 05/31/2015 04/28/2015 27/12/8240  Systolic BP 353 614 -  Diastolic BP 76 72 -  Wt. (Lbs) 138.6 - 139.99  BMI 22.38 - 22.61    Lab Results  Component Value Date   HGBA1C 10.1* 04/25/2015   CMP Latest Ref Rng 04/26/2015 04/25/2015  Glucose 65 - 99 mg/dL 216(H) 473(H)  BUN 6 - 20 mg/dL <5(L) 9  Creatinine 0.44 - 1.00 mg/dL 0.53 0.86  Sodium 135 - 145 mmol/L 134(L) 129(L)  Potassium 3.5 - 5.1 mmol/L 3.6 3.4(L)  Chloride 101 - 111 mmol/L 102 91(L)  CO2 22 - 32 mmol/L 25 25  Calcium 8.9 - 10.3 mg/dL 8.2(L) 9.7  Total Protein 6.5 - 8.1 g/dL - 7.2  Total Bilirubin 0.3 - 1.2 mg/dL - 0.6  Alkaline Phos 38 - 126 U/L - 100  AST 15 - 41 U/L - 18  ALT 14 - 54 U/L - 14      Physical Exam  Constitutional: She is oriented to person, place, and time. She appears well-developed and well-nourished.  Cardiovascular: Normal rate, normal heart sounds and intact distal pulses.   No murmur heard. Pulmonary/Chest: Effort normal and breath sounds normal. She  has no wheezes. She has no rales. She exhibits no tenderness.  Abdominal: Soft. Bowel sounds are normal. She exhibits no distension and no mass. There is no tenderness.  Musculoskeletal: She exhibits tenderness (tenderness in the C-spine; also on range of motion of left shoulder and tenderness is reduced with left arm flexed and internally rotated).  Neurological: She is alert and oriented to person, place, and time.     Assessment & Plan:   1. Type 2 diabetes mellitus with hyperglycemia, without long-term current use of insulin ([place]) Newly diagnosed, uncontrolled with A1c of 10.1. Dose of Amaryl increased from 2 mg to 4 mg Blood sugar log will be reviewed at her next visit - Glucose (CBG) - glimepiride (AMARYL) 4 MG tablet; Take 1 tablet (4 mg total) by mouth daily with breakfast.  Dispense: 30 tablet; Refill: 2 - metFORMIN (GLUCOPHAGE) 1000 MG tablet; Take 1 tablet (1,000 mg total) by  mouth 2 (two) times daily with a meal.  Dispense: 60 tablet; Refill: 2 - glucose blood (TRUE METRIX BLOOD GLUCOSE TEST) test strip; Used 3 times daily before meals  Dispense: 100 each; Refill: 12 - Blood Glucose Monitoring Suppl (TRUE METRIX METER) DEVI; 1 each by Does not apply route 3 (three) times daily before meals.  Dispense: 1 Device; Refill: 0 - TRUEPLUS LANCETS 28G MISC; 1 each by Does not apply route 3 (three) times daily before meals.  Dispense: 100 each; Refill: 12  2. Cervical disc disorder with radiculopathy Serial need to refer her to neurosurgery or pain management for epidural injections and I have advised her to apply for the [facility] discount which will enable [place] facilitate this process - gabapentin (NEURONTIN) 300 MG capsule; Take 1 capsule (300 mg total) by mouth 2 (two) times daily.  Dispense: 60 capsule; Refill: 2 - acetaminophen-codeine (TYLENOL #3) 300-30 MG tablet; Take 1 tablet by mouth every 8 (eight) hours as needed for moderate pain.  Dispense: 60 tablet; Refill: 0 - ketorolac  (TORADOL) injection 60 mg; Inject 2 mLs (60 mg total) into the muscle once.  3. Insomnia She was previously on a short acting benzodiazepine for insomnia which I have discouraged. We'll reassess for improvement in next visit - traZODone (DESYREL) 100 MG tablet; Take 1 tablet (100 mg total) by mouth at bedtime as needed for sleep.  Dispense: 30 tablet; Refill: 2  4. Depression Stable - FLUoxetine (PROZAC) 10 MG capsule; Take 1 capsule (10 mg total) by mouth daily.  Dispense: 30 capsule; Refill: 2  5. Pure hypercholesterolemia Stable - pravastatin (PRAVACHOL) 10 MG tablet; Take 1 tablet (10 mg total) by mouth at bedtime.  Dispense: 30 tablet; Refill: 3   Meds ordered this encounter  Medications  . FLUoxetine (PROZAC) 10 MG capsule    Sig: Take 1 capsule (10 mg total) by mouth daily.    Dispense:  30 capsule    Refill:  2  . gabapentin (NEURONTIN) 300 MG capsule    Sig: Take 1 capsule (300 mg total) by mouth 2 (two) times daily.    Dispense:  60 capsule    Refill:  2  . glimepiride (AMARYL) 4 MG tablet    Sig: Take 1 tablet (4 mg total) by mouth daily with breakfast.    Dispense:  30 tablet    Refill:  2  . acetaminophen-codeine (TYLENOL #3) 300-30 MG tablet    Sig: Take 1 tablet by mouth every 8 (eight) hours as needed for moderate pain.    Dispense:  60 tablet    Refill:  0  . traZODone (DESYREL) 100 MG tablet    Sig: Take 1 tablet (100 mg total) by mouth at bedtime as needed for sleep.    Dispense:  30 tablet    Refill:  2  . metFORMIN (GLUCOPHAGE) 1000 MG tablet    Sig: Take 1 tablet (1,000 mg total) by mouth 2 (two) times daily with a meal.    Dispense:  60 tablet    Refill:  2  . pravastatin (PRAVACHOL) 10 MG tablet    Sig: Take 1 tablet (10 mg total) by mouth at bedtime.    Dispense:  30 tablet    Refill:  3  . glucose blood (TRUE METRIX BLOOD GLUCOSE TEST) test strip    Sig: Used 3 times daily before meals    Dispense:  100 each    Refill:  12  . Blood Glucose  Monitoring Suppl (  TRUE METRIX METER) DEVI    Sig: 1 each by Does not apply route 3 (three) times daily before meals.    Dispense:  1 Device    Refill:  0  . TRUEPLUS LANCETS 28G MISC    Sig: 1 each by Does not apply route 3 (three) times daily before meals.    Dispense:  100 each    Refill:  12  . ketorolac (TORADOL) injection 60 mg    Sig:     Follow-up: Return in about 1 month (around 07/01/2015) for follow up on cervical radiculopathy.   Arnoldo Morale MD

## 2015-07-08 ENCOUNTER — Encounter (HOSPITAL_COMMUNITY): Payer: Self-pay | Admitting: Neurology

## 2015-07-08 ENCOUNTER — Emergency Department (HOSPITAL_COMMUNITY): Payer: Self-pay

## 2015-07-08 ENCOUNTER — Emergency Department (HOSPITAL_COMMUNITY)
Admission: EM | Admit: 2015-07-08 | Discharge: 2015-07-08 | Disposition: A | Discharge status: Home / Self Care | Payer: Self-pay | Attending: Emergency Medicine | Admitting: Emergency Medicine

## 2015-07-08 DIAGNOSIS — M5412 Radiculopathy, cervical region: Secondary | ICD-10-CM | POA: Insufficient documentation

## 2015-07-08 DIAGNOSIS — Z87448 Personal history of other diseases of urinary system: Secondary | ICD-10-CM | POA: Insufficient documentation

## 2015-07-08 DIAGNOSIS — Z8709 Personal history of other diseases of the respiratory system: Secondary | ICD-10-CM | POA: Insufficient documentation

## 2015-07-08 DIAGNOSIS — G8929 Other chronic pain: Secondary | ICD-10-CM | POA: Insufficient documentation

## 2015-07-08 DIAGNOSIS — G4700 Insomnia, unspecified: Secondary | ICD-10-CM | POA: Insufficient documentation

## 2015-07-08 DIAGNOSIS — F419 Anxiety disorder, unspecified: Secondary | ICD-10-CM | POA: Insufficient documentation

## 2015-07-08 DIAGNOSIS — K219 Gastro-esophageal reflux disease without esophagitis: Secondary | ICD-10-CM | POA: Insufficient documentation

## 2015-07-08 DIAGNOSIS — Z8542 Personal history of malignant neoplasm of other parts of uterus: Secondary | ICD-10-CM | POA: Insufficient documentation

## 2015-07-08 DIAGNOSIS — Z79899 Other long term (current) drug therapy: Secondary | ICD-10-CM | POA: Insufficient documentation

## 2015-07-08 DIAGNOSIS — F17210 Nicotine dependence, cigarettes, uncomplicated: Secondary | ICD-10-CM | POA: Insufficient documentation

## 2015-07-08 DIAGNOSIS — Z794 Long term (current) use of insulin: Secondary | ICD-10-CM | POA: Insufficient documentation

## 2015-07-08 LAB — BASIC METABOLIC PANEL
ANION GAP: 13 (ref 5–15)
BUN: 13 mg/dL (ref 6–20)
CHLORIDE: 99 mmol/L — AB (ref 101–111)
CO2: 24 mmol/L (ref 22–32)
Calcium: 9.8 mg/dL (ref 8.9–10.3)
Creatinine, Ser: 0.69 mg/dL (ref 0.44–1.00)
GFR calc non Af Amer: >60 mL/min (ref >60)
Glucose, Bld: 284 mg/dL — ABNORMAL HIGH (ref 65–99)
POTASSIUM: 3.6 mmol/L (ref 3.5–5.1)
Sodium: 136 mmol/L (ref 135–145)

## 2015-07-08 LAB — CBC
HEMATOCRIT: 42.1 % (ref 36.0–46.0)
HEMOGLOBIN: 15.2 g/dL — AB (ref 12.0–15.0)
MCH: 32.2 pg (ref 26.0–34.0)
MCHC: 36.1 g/dL — ABNORMAL HIGH (ref 30.0–36.0)
MCV: 89.2 fL (ref 78.0–100.0)
Platelets: 252 10*3/uL (ref 150–400)
RBC: 4.72 MIL/uL (ref 3.87–5.11)
RDW: 12.9 % (ref 11.5–15.5)
WBC: 11.4 10*3/uL — AB (ref 4.0–10.5)

## 2015-07-08 LAB — I-STAT TROPONIN, ED: Troponin i, poc: 0 ng/mL (ref 0.00–0.08)

## 2015-07-08 MED ORDER — OXYCODONE-ACETAMINOPHEN 5-325 MG PO TABS
ORAL_TABLET | ORAL | Status: DC
Start: 2015-07-08 — End: 2015-07-09
  Filled 2015-07-08: qty 1

## 2015-07-08 MED ORDER — METHYLPREDNISOLONE SODIUM SUCC 125 MG IJ SOLR
125.0000 mg | Freq: Once | INTRAMUSCULAR | Status: AC
  Administered 2015-07-08: 125 mg via INTRAMUSCULAR
  Filled 2015-07-08: qty 2

## 2015-07-08 MED ORDER — HYDROCODONE-ACETAMINOPHEN 5-325 MG PO TABS: 2.0000 | ORAL_TABLET | ORAL | Status: DC | PRN

## 2015-07-08 MED ORDER — KETOROLAC TROMETHAMINE 60 MG/2ML IM SOLN
60.0000 mg | Freq: Once | INTRAMUSCULAR | Status: AC
  Administered 2015-07-08: 60 mg via INTRAMUSCULAR
  Filled 2015-07-08: qty 2

## 2015-07-08 MED ORDER — OXYCODONE-ACETAMINOPHEN 5-325 MG PO TABS
1.0000 | ORAL_TABLET | Freq: Once | ORAL | Status: AC
  Administered 2015-07-08: 1 via ORAL

## 2015-07-08 NOTE — ED Provider Notes (Signed)
CSN: [id number]     Arrival date & time 07/08/15  1641 History   First MD Initiated Contact with Patient 07/08/15 2033     Chief Complaint  Patient presents with  . Neck Pain  . Arm Pain     (Consider location/radiation/quality/duration/timing/severity/associated sxs/prior Treatment) Patient is a 50 y.o. female presenting with neck pain and arm pain. The history is provided by the patient. No language interpreter was used.  Neck Pain Pain location:  Generalized neck Quality:  Aching, burning and stabbing Pain radiates to:  L shoulder and L arm Pain severity:  Moderate Pain is:  Same all the time Duration:  2 months Timing:  Constant Chronicity:  New Relieved by:  Nothing Worsened by:  Nothing tried Ineffective treatments:  None tried Risk factors: no hx of head and neck radiation   Arm Pain Associated symptoms include neck pain.    Past Medical History  Diagnosis Date  . Anxiety   . Insomnia   . Tobacco abuse   . Pyelonephritis     Archie Endo 04/25/2015  . Chronic bronchitis ([place])   . GERD (gastroesophageal reflux disease)   . Chronic lower back pain   . Uterine cancer [facility])    Past Surgical History  Procedure Laterality Date  . Foot fracture surgery Right 2015  . Fracture surgery    . Abdominal hysterectomy  1980's  . Tubal ligation  1983   No family history on file. Social History  Substance Use Topics  . Smoking status: Current Every Day Smoker -- 0.50 packs/day for 34 years    Types: Cigarettes  . Smokeless tobacco: Never Used  . Alcohol Use: No   OB History    No data available     Review of Systems  Musculoskeletal: Positive for neck pain.  All other systems reviewed and are negative.     Allergies  Levaquin and Sulfa antibiotics  Home Medications   Prior to Admission medications   Medication Sig Start Date End Date Taking? Authorizing Provider  ALPRAZolam (XANAX) 0.25 MG tablet Take 1 tablet (0.25 mg total) by mouth at bedtime as needed  (anxiety). 04/28/15  Yes Ripudeep Krystal Eaton, MD  Aspirin-Salicylamide-Caffeine (BC FAST PAIN RELIEF) 650-195-33.3 MG PACK Take 1 Package by mouth daily as needed (for pain).   Yes Historical Provider, MD  FLUoxetine (PROZAC) 10 MG capsule Take 1 capsule (10 mg total) by mouth daily. Patient taking differently: Take 10 mg by mouth at bedtime.  05/31/15  Yes Arnoldo Morale, MD  gabapentin (NEURONTIN) 300 MG capsule Take 1 capsule (300 mg total) by mouth 2 (two) times daily. 05/31/15  Yes Arnoldo Morale, MD  glimepiride (AMARYL) 4 MG tablet Take 1 tablet (4 mg total) by mouth daily with breakfast. 05/31/15  Yes Arnoldo Morale, MD  insulin regular (NOVOLIN R) 100 units/mL injection Correction scale prior to meals three times a day 121-150- 2 units,  151-200- 3 units,  201-250- 5 units,  251-300- 8 units,  301-350- 11 units, 351-400- 15 units, > 400- 15 units and call MD  Bedtime correction scale: 201-250-2 units,  251-300-3 units,  301-350-4 units, 351-400-5 units, >400- 5 units and call MD 04/28/15  Yes Ripudeep K Rai, MD  lidocaine (LIDODERM) 5 % Place 1 patch onto the skin daily. Remove & Discard patch within 12 hours or as directed by MD Patient taking differently: Place 1 patch onto the skin daily as needed (for lower back). Remove & Discard patch within 12 hours or as directed by  MD 04/28/15  Yes Ripudeep Krystal Eaton, MD  metFORMIN (GLUCOPHAGE) 1000 MG tablet Take 1 tablet (1,000 mg total) by mouth 2 (two) times daily with a meal. 05/31/15  Yes Arnoldo Morale, MD  pravastatin (PRAVACHOL) 10 MG tablet Take 1 tablet (10 mg total) by mouth at bedtime. 05/31/15  Yes Arnoldo Morale, MD  promethazine (PHENERGAN) 12.5 MG tablet Take 1 tablet (12.5 mg total) by mouth every 6 (six) hours as needed for nausea or vomiting. 04/28/15  Yes Ripudeep Krystal Eaton, MD  traZODone (DESYREL) 100 MG tablet Take 1 tablet (100 mg total) by mouth at bedtime as needed for sleep. 05/31/15  Yes Arnoldo Morale, MD  acetaminophen-codeine (TYLENOL #3)  300-30 MG tablet Take 1 tablet by mouth every 8 (eight) hours as needed for moderate pain. 05/31/15   Arnoldo Morale, MD  blood glucose meter kit and supplies KIT Dispense based on patient and insurance preference. Use up to four times daily as directed. (FOR ICD-9 250.00, 250.01). Patient not taking: Reported on 05/31/2015 04/28/15   Ripudeep Krystal Eaton, MD  Blood Glucose Monitoring Suppl (TRUE METRIX METER) DEVI 1 each by Does not apply route 3 (three) times daily before meals. 05/31/15   Arnoldo Morale, MD  glucose blood (TRUE METRIX BLOOD GLUCOSE TEST) test strip Used 3 times daily before meals 05/31/15   Arnoldo Morale, MD  insulin starter kit- syringes MISC 1 kit by Other route once. 04/28/15   Ripudeep Krystal Eaton, MD  Insulin Syringes, Disposable, U-100 0.5 ML MISC Type 2 diabetes mellitus 04/28/15   Ripudeep Krystal Eaton, MD  TRUEPLUS LANCETS 28G MISC 1 each by Does not apply route 3 (three) times daily before meals. 05/31/15   Arnoldo Morale, MD   BP 135/66 mmHg  Pulse 61  Temp(Src) 98.4 F (36.9 C) (Oral)  Resp 18  SpO2 100% Physical Exam  Constitutional: She appears well-developed and well-nourished.  HENT:  Head: Normocephalic.  Right Ear: External ear normal.  Left Ear: External ear normal.  Nose: Nose normal.  Mouth/Throat: Oropharynx is clear and moist.  Eyes: Conjunctivae are normal. Pupils are equal, round, and reactive to light.  Neck: Normal range of motion.  Cardiovascular: Normal rate and normal heart sounds.   Pulmonary/Chest: Effort normal.  Abdominal: Soft.  Musculoskeletal: Normal range of motion.  Neurological: She is alert. She has normal reflexes.  Skin: Skin is warm.  Vitals reviewed.   ED Course  Procedures (including critical care time) Labs Review Labs Reviewed  BASIC METABOLIC PANEL - Abnormal; Notable for the following:    Chloride 99 (*)    Glucose, Bld 284 (*)    All other components within normal limits  CBC - Abnormal; Notable for the following:    WBC 11.4 (*)     Hemoglobin 15.2 (*)    MCHC 36.1 (*)    All other components within normal limits  I-STAT TROPOININ, ED    Imaging Review Dg Chest 2 View  07/08/2015  CLINICAL DATA:  Chest discomfort for 2-3 days EXAM: CHEST - 2 VIEW COMPARISON:  04/25/2015 FINDINGS: Cardiac shadow is within normal limits. The lungs are well aerated bilaterally. No focal infiltrate or sizable effusion is seen. No acute bony abnormality is noted. IMPRESSION: No active disease. Electronically Signed   By: Inez Catalina M.D.   On: 07/08/2015 17:55   I have personally reviewed and evaluated these images and lab results as part of my medical decision-making.   EKG Interpretation None      MDM labs  and chest xray are normal.  Pt has had radicular neck pain for over 2 months.  [facility]     Final diagnoses:  Cervical radiculopathy    Solumedrol torodol  Hydrocodone  Rx Call [facility] on Monday to be seen for recheck    Fransico Meadow, PA-C 07/08/15 2154  Forde Dandy, MD 07/09/15 1315

## 2015-07-08 NOTE — Discharge Instructions (Signed)

## 2015-07-08 NOTE — ED Notes (Signed)
Pt reports 2 degenerative discs in her neck with tingling and numbness to left arm. For past 2 days, has had pain to right side of her neck. The pain in her neck is new but the tingling and numbness is old. Also today having left sided cp which she has had for 1 month.

## 2015-07-08 NOTE — ED Notes (Signed)
Pt crying, tearful c/o neck pain.

## 2015-07-12 MED FILL — ACETAMINOPHEN/COD #3 TABLET: 300-30 | 20 days supply | Qty: 60 | Fill #0

## 2015-07-12 MED FILL — PRAVASTATIN SODIUM 10 MG TA: 10 | 30 days supply | Qty: 30 | Fill #1

## 2015-07-12 MED FILL — FLUoxetine HCL 10 MG CAPS: 10 | 30 days supply | Qty: 30 | Fill #1

## 2015-07-12 MED FILL — traZODone HCL 100 MG TABS: 100 | 30 days supply | Qty: 30 | Fill #1

## 2015-07-12 MED FILL — GLIMEPIRIDE 4 MG TABLET: 4 | 30 days supply | Qty: 30 | Fill #1

## 2015-08-10 ENCOUNTER — Emergency Department (HOSPITAL_COMMUNITY)
Admission: EM | Admit: 2015-08-10 | Discharge: 2015-08-10 | Disposition: A | Discharge status: Home / Self Care | Payer: Self-pay | Attending: Emergency Medicine | Admitting: Emergency Medicine

## 2015-08-10 ENCOUNTER — Emergency Department (HOSPITAL_COMMUNITY): Payer: Self-pay

## 2015-08-10 DIAGNOSIS — F419 Anxiety disorder, unspecified: Secondary | ICD-10-CM | POA: Insufficient documentation

## 2015-08-10 DIAGNOSIS — N760 Acute vaginitis: Secondary | ICD-10-CM | POA: Insufficient documentation

## 2015-08-10 DIAGNOSIS — Z79899 Other long term (current) drug therapy: Secondary | ICD-10-CM | POA: Insufficient documentation

## 2015-08-10 DIAGNOSIS — Z8542 Personal history of malignant neoplasm of other parts of uterus: Secondary | ICD-10-CM | POA: Insufficient documentation

## 2015-08-10 DIAGNOSIS — R112 Nausea with vomiting, unspecified: Secondary | ICD-10-CM | POA: Insufficient documentation

## 2015-08-10 DIAGNOSIS — R109 Unspecified abdominal pain: Secondary | ICD-10-CM

## 2015-08-10 DIAGNOSIS — Z7984 Long term (current) use of oral hypoglycemic drugs: Secondary | ICD-10-CM | POA: Insufficient documentation

## 2015-08-10 DIAGNOSIS — Z794 Long term (current) use of insulin: Secondary | ICD-10-CM | POA: Insufficient documentation

## 2015-08-10 DIAGNOSIS — F17210 Nicotine dependence, cigarettes, uncomplicated: Secondary | ICD-10-CM | POA: Insufficient documentation

## 2015-08-10 DIAGNOSIS — G8929 Other chronic pain: Secondary | ICD-10-CM | POA: Insufficient documentation

## 2015-08-10 DIAGNOSIS — Z87448 Personal history of other diseases of urinary system: Secondary | ICD-10-CM | POA: Insufficient documentation

## 2015-08-10 DIAGNOSIS — R110 Nausea: Secondary | ICD-10-CM

## 2015-08-10 DIAGNOSIS — R197 Diarrhea, unspecified: Secondary | ICD-10-CM | POA: Insufficient documentation

## 2015-08-10 DIAGNOSIS — B9689 Other specified bacterial agents as the cause of diseases classified elsewhere: Secondary | ICD-10-CM

## 2015-08-10 DIAGNOSIS — Z8719 Personal history of other diseases of the digestive system: Secondary | ICD-10-CM | POA: Insufficient documentation

## 2015-08-10 LAB — COMPREHENSIVE METABOLIC PANEL
ALBUMIN: 4.2 g/dL (ref 3.5–5.0)
ALT: 25 U/L (ref 14–54)
ANION GAP: 11 (ref 5–15)
AST: 26 U/L (ref 15–41)
Alkaline Phosphatase: 74 U/L (ref 38–126)
BILIRUBIN TOTAL: 0.5 mg/dL (ref 0.3–1.2)
BUN: 10 mg/dL (ref 6–20)
CHLORIDE: 101 mmol/L (ref 101–111)
CO2: 26 mmol/L (ref 22–32)
Calcium: 9.9 mg/dL (ref 8.9–10.3)
Creatinine, Ser: 0.63 mg/dL (ref 0.44–1.00)
GFR calc Af Amer: >60 mL/min (ref >60)
GLUCOSE: 147 mg/dL — AB (ref 65–99)
POTASSIUM: 3.7 mmol/L (ref 3.5–5.1)
Sodium: 138 mmol/L (ref 135–145)
TOTAL PROTEIN: 7.2 g/dL (ref 6.5–8.1)

## 2015-08-10 LAB — URINALYSIS, ROUTINE W REFLEX MICROSCOPIC
GLUCOSE, UA: NEGATIVE mg/dL
Hgb urine dipstick: NEGATIVE
Ketones, ur: 15 mg/dL — AB
NITRITE: NEGATIVE
PH: 7.5 (ref 5.0–8.0)
Protein, ur: NEGATIVE mg/dL
SPECIFIC GRAVITY, URINE: 1.026 (ref 1.005–1.030)

## 2015-08-10 LAB — CBC
HEMATOCRIT: 43.7 % (ref 36.0–46.0)
HEMOGLOBIN: 15.6 g/dL — AB (ref 12.0–15.0)
MCH: 32.1 pg (ref 26.0–34.0)
MCHC: 35.7 g/dL (ref 30.0–36.0)
MCV: 89.9 fL (ref 78.0–100.0)
Platelets: 256 10*3/uL (ref 150–400)
RBC: 4.86 MIL/uL (ref 3.87–5.11)
RDW: 12.4 % (ref 11.5–15.5)
WBC: 10 10*3/uL (ref 4.0–10.5)

## 2015-08-10 LAB — URINE MICROSCOPIC-ADD ON

## 2015-08-10 LAB — LIPASE, BLOOD: LIPASE: 77 U/L — AB (ref 11–51)

## 2015-08-10 LAB — WET PREP, GENITAL
SPERM: NONE SEEN
TRICH WET PREP: NONE SEEN
YEAST WET PREP: NONE SEEN

## 2015-08-10 MED ORDER — MORPHINE SULFATE (PF) 4 MG/ML IV SOLN
4.0000 mg | Freq: Once | INTRAVENOUS | Status: AC
  Administered 2015-08-10: 4 mg via INTRAVENOUS
  Filled 2015-08-10: qty 1

## 2015-08-10 MED ORDER — ONDANSETRON HCL 4 MG PO TABS: 4.0000 mg | ORAL_TABLET | Freq: Four times a day (QID) | ORAL | Status: DC

## 2015-08-10 MED ORDER — ONDANSETRON HCL 4 MG/2ML IJ SOLN
4.0000 mg | Freq: Once | INTRAMUSCULAR | Status: AC
  Administered 2015-08-10: 4 mg via INTRAVENOUS
  Filled 2015-08-10: qty 2

## 2015-08-10 MED ORDER — SODIUM CHLORIDE 0.9 % IV BOLUS (SEPSIS)
1000.0000 mL | Freq: Once | INTRAVENOUS | Status: AC
  Administered 2015-08-10: 1000 mL via INTRAVENOUS

## 2015-08-10 MED ORDER — METRONIDAZOLE 500 MG PO TABS: 500.0000 mg | ORAL_TABLET | Freq: Two times a day (BID) | ORAL | Status: DC

## 2015-08-10 NOTE — ED Notes (Signed)
Pt reports left sided flank pain that started about 2 days ago. Reports n/v with the pain.

## 2015-08-10 NOTE — ED Provider Notes (Signed)
CSN: [id number]     Arrival date & time 08/10/15  1438 History  By signing my name below, I, Bethany Mccarthy, attest that this documentation has been prepared under the direction and in the presence of Bethany Durango, PA-C. Electronically Signed: Helane Mccarthy, ED Scribe. 08/10/2015. 5:44 PM.    Chief Complaint  Patient presents with  . Flank Pain   The history is provided by the patient. No language interpreter was used.   HPI Comments: Bethany Mccarthy is a 50 y.o. female smoker at 0.5 ppd with a PMHx of pyelonephritis, chronic lower back pain, and uterine cancer who presents to the Emergency Department complaining of left flank pain radiating down into the front of the abdomen onset 2 days ago. The pain radiates into the suprapubic region. She describes it as an aching and throbbing pain. The pain has been waxing and waning since onset. Her abdominal pain is exacerbated by palpation and walking. Denies alleviating factors. Associated nausea and vomiting. She denies blood in her vomit. She denies associated diarrhea. She does note some mild dysuria and she states "I think I saw some blood in there once".  Pt denies fever, chills, URI symptoms, cough, shortness of breath, constipation, right flank pain, generalized myalgias, vaginal discharge, pelvic pain or rashes.   Past Medical History  Diagnosis Date  . Anxiety   . Insomnia   . Tobacco abuse   . Pyelonephritis     Archie Endo 04/25/2015  . Chronic bronchitis ([place])   . GERD (gastroesophageal reflux disease)   . Chronic lower back pain   . Uterine cancer [facility])    Past Surgical History  Procedure Laterality Date  . Foot fracture surgery Right 2015  . Fracture surgery    . Abdominal hysterectomy  1980's  . Tubal ligation  1983   No family history on file. Social History  Substance Use Topics  . Smoking status: Current Every Day Smoker -- 0.50 packs/day for 34 years    Types: Cigarettes  . Smokeless tobacco: Never Used  . Alcohol Use: No    OB History    No data available     Review of Systems  Gastrointestinal: Positive for nausea, vomiting and abdominal pain.  Genitourinary: Positive for dysuria, frequency, hematuria and flank pain.  All other systems reviewed and are negative.   Allergies  Levaquin and Sulfa antibiotics  Home Medications   Prior to Admission medications   Medication Sig Start Date End Date Taking? Authorizing Provider  acetaminophen-codeine (TYLENOL #3) 300-30 MG tablet Take 1 tablet by mouth every 8 (eight) hours as needed for moderate pain. 05/31/15   Arnoldo Morale, MD  ALPRAZolam (XANAX) 0.25 MG tablet Take 1 tablet (0.25 mg total) by mouth at bedtime as needed (anxiety). 04/28/15   Ripudeep Krystal Eaton, MD  Aspirin-Salicylamide-Caffeine (BC FAST PAIN RELIEF) [phone] MG PACK Take 1 Package by mouth daily as needed (for pain).    Historical Provider, MD  blood glucose meter kit and supplies KIT Dispense based on patient and insurance preference. Use up to four times daily as directed. (FOR ICD-9 250.00, 250.01). Patient not taking: Reported on 05/31/2015 04/28/15   Ripudeep Krystal Eaton, MD  Blood Glucose Monitoring Suppl (TRUE METRIX METER) DEVI 1 each by Does not apply route 3 (three) times daily before meals. 05/31/15   Arnoldo Morale, MD  FLUoxetine (PROZAC) 10 MG capsule Take 1 capsule (10 mg total) by mouth daily. Patient taking differently: Take 10 mg by mouth at bedtime.  05/31/15  Arnoldo Morale, MD  gabapentin (NEURONTIN) 300 MG capsule Take 1 capsule (300 mg total) by mouth 2 (two) times daily. 05/31/15   Arnoldo Morale, MD  glimepiride (AMARYL) 4 MG tablet Take 1 tablet (4 mg total) by mouth daily with breakfast. 05/31/15   Arnoldo Morale, MD  glucose blood (TRUE METRIX BLOOD GLUCOSE TEST) test strip Used 3 times daily before meals 05/31/15   Arnoldo Morale, MD  HYDROcodone-acetaminophen (NORCO/VICODIN) 5-325 MG tablet Take 2 tablets by mouth every 4 (four) hours as needed. 07/08/15   Fransico Meadow, PA-C   insulin regular (NOVOLIN R) 100 units/mL injection Correction scale prior to meals three times a day 121-150- 2 units,  151-200- 3 units,  201-250- 5 units,  251-300- 8 units,  301-350- 11 units, 351-400- 15 units, > 400- 15 units and call MD  Bedtime correction scale: 201-250-2 units,  251-300-3 units,  301-350-4 units, 351-400-5 units, >400- 5 units and call MD 04/28/15   Ripudeep Krystal Eaton, MD  insulin starter kit- syringes MISC 1 kit by Other route once. 04/28/15   Ripudeep Krystal Eaton, MD  Insulin Syringes, Disposable, U-100 0.5 ML MISC Type 2 diabetes mellitus 04/28/15   Ripudeep K Rai, MD  lidocaine (LIDODERM) 5 % Place 1 patch onto the skin daily. Remove & Discard patch within 12 hours or as directed by MD Patient taking differently: Place 1 patch onto the skin daily as needed (for lower back). Remove & Discard patch within 12 hours or as directed by MD 04/28/15   Ripudeep Krystal Eaton, MD  metFORMIN (GLUCOPHAGE) 1000 MG tablet Take 1 tablet (1,000 mg total) by mouth 2 (two) times daily with a meal. 05/31/15   Arnoldo Morale, MD  metroNIDAZOLE (FLAGYL) 500 MG tablet Take 1 tablet (500 mg total) by mouth 2 (two) times daily. 08/10/15   Joyce Leckey, PA-C  ondansetron (ZOFRAN) 4 MG tablet Take 1 tablet (4 mg total) by mouth every 6 (six) hours. 08/10/15   Tyshauna Finkbiner, PA-C  pravastatin (PRAVACHOL) 10 MG tablet Take 1 tablet (10 mg total) by mouth at bedtime. 05/31/15   Arnoldo Morale, MD  promethazine (PHENERGAN) 12.5 MG tablet Take 1 tablet (12.5 mg total) by mouth every 6 (six) hours as needed for nausea or vomiting. 04/28/15   Ripudeep Krystal Eaton, MD  traZODone (DESYREL) 100 MG tablet Take 1 tablet (100 mg total) by mouth at bedtime as needed for sleep. 05/31/15   Arnoldo Morale, MD  TRUEPLUS LANCETS 28G MISC 1 each by Does not apply route 3 (three) times daily before meals. 05/31/15   Arnoldo Morale, MD   BP 126/64 mmHg  Pulse 67  Temp(Src) 97.8 F (36.6 C) (Oral)  Resp 14  SpO2 97% Physical Exam   Constitutional: She is oriented to person, place, and time. She appears well-developed and well-nourished. No distress.  Nontoxic appearing  HENT:  Head: Normocephalic and atraumatic.  Mouth/Throat: Oropharynx is clear and moist.  Eyes: Conjunctivae are normal. Right eye exhibits no discharge. Left eye exhibits no discharge.  Cardiovascular: Normal rate, regular rhythm and normal heart sounds.   Pulmonary/Chest: Effort normal and breath sounds normal. No respiratory distress.  Abdominal: Soft. Normal appearance. There is tenderness in the suprapubic area. There is no rigidity, no rebound and no guarding.    Tenderness over suprapubic region extending towards left flank. No rebound, guarding or rigidity. No CVA tenderness.   Genitourinary: Cervix exhibits no motion tenderness, no discharge and no friability. Right adnexum displays no tenderness and no fullness.  Left adnexum displays no tenderness and no fullness. No tenderness or bleeding in the vagina. Vaginal discharge found.  Thick, white vaginal discharge noted on exam. No tenderness on exam. No CMT and adnexal tenderness or fullness.   Musculoskeletal: Normal range of motion.  Lymphadenopathy:       Right: No inguinal adenopathy present.       Left: No inguinal adenopathy present.  Neurological: She is alert and oriented to person, place, and time. Coordination normal.  Skin: Skin is warm and dry. No rash noted. She is not diaphoretic. No erythema.  Psychiatric: She has a normal mood and affect.  Nursing note and vitals reviewed.   ED Course  Procedures  DIAGNOSTIC STUDIES: Oxygen Saturation is 100% on RA, normal by my interpretation.    COORDINATION OF CARE: 5:41 PM - Discussed plans to order urinalysis, anti-nausea medication, and IV fluids, as well as a CT scan. Pt advised of plan for treatment and pt agrees.  Labs Review Labs Reviewed  WET PREP, GENITAL - Abnormal; Notable for the following:    Clue Cells Wet Prep HPF POC  PRESENT (*)    WBC, Wet Prep HPF POC FEW (*)    All other components within normal limits  LIPASE, BLOOD - Abnormal; Notable for the following:    Lipase 77 (*)    All other components within normal limits  COMPREHENSIVE METABOLIC PANEL - Abnormal; Notable for the following:    Glucose, Bld 147 (*)    All other components within normal limits  CBC - Abnormal; Notable for the following:    Hemoglobin 15.6 (*)    All other components within normal limits  URINALYSIS, ROUTINE W REFLEX MICROSCOPIC (NOT AT [facility]) - Abnormal; Notable for the following:    Color, Urine AMBER (*)    APPearance CLOUDY (*)    Bilirubin Urine SMALL (*)    Ketones, ur 15 (*)    Leukocytes, UA TRACE (*)    All other components within normal limits  URINE MICROSCOPIC-ADD ON - Abnormal; Notable for the following:    Squamous Epithelial / LPF 6-30 (*)    Bacteria, UA RARE (*)    All other components within normal limits  GC/CHLAMYDIA PROBE AMP ([place]) NOT AT [facility]    Imaging Review Ct Renal Stone Study  08/10/2015  CLINICAL DATA:  Right flank pain for 1 day. Nausea and vomiting. History of kidney stones. EXAM: CT ABDOMEN AND PELVIS WITHOUT CONTRAST TECHNIQUE: Multidetector CT imaging of the abdomen and pelvis was performed following the standard protocol without IV contrast. COMPARISON:  Contrast-enhanced CT 04/25/2015 FINDINGS: Lower chest: The included lung bases are clear. Heart size is normal. No pleural effusion. Liver: No focal hepatic lesion allowing for lack contrast. Hepatobiliary: Gallbladder physiologically distended, no calcified stone. No biliary dilatation. Pancreas: No ductal dilatation or inflammation. Spleen: Normal. Adrenal glands: Hypodense nodule in the lateral limb of the left adrenal gland is consistent with benign adenoma. Right adrenal gland is normal. Kidneys: Nonobstructing stones in both kidneys, no hydronephrosis or perinephric stranding. 2 stones on the right and 1 stone on the left. Cyst  in the upper right kidney is unchanged from prior exam. Cyst in the upper left kidney not characterized without contrast. Stomach/Bowel: Stomach physiologically distended. There are no dilated or thickened small bowel loops. Small volume of stool throughout the colon without colonic wall thickening. The appendix measures 9 mm which is prominent, however unchanged from prior exam without intraluminal fluid or periappendiceal inflammation. Vascular/Lymphatic: Multiple  retroperitoneal surgical clips. No retroperitoneal adenopathy. Abdominal aorta is normal in caliber. Moderate atherosclerotic calcifications of the abdominal aorta and iliac branches, advanced for age. Reproductive: Post hysterectomy.  No adnexal mass. Bladder: Physiologically distended. No intraluminal stone. No intravesicular air. Other: No free air, free fluid, or intra-abdominal fluid collection. Musculoskeletal: There are no acute or suspicious osseous abnormalities. Minimal anterior wedging of L1 is chronic. IMPRESSION: 1. Bilateral nonobstructing renal calculi without hydronephrosis or obstructive uropathy. 2. No acute abnormality in the abdomen/pelvis. Atherosclerosis, advanced for age but unchanged. Electronically Signed   By: Jeb Levering M.D.   On: 08/10/2015 18:54   I have personally reviewed and evaluated these images and lab results as part of my medical decision-making.   EKG Interpretation None      MDM   Final diagnoses:  Flank pain  BV (bacterial vaginosis)  Nausea   50 year old female presenting with left flank pain with associated nausea and vomiting 2 days. She also notes dysuria, increased frequency and one episode of hematuria. Vital signs stable. Patient is nontoxic-appearing in no acute distress. Generalized tenderness over the suprapubic and left mid abdomen. Peritoneal signs suggesting surgical abdomen. No CVA tenderness. Moderate amount of thick white discharge noted on pelvic exam. No CMT or adnexal  tenderness or fullness. No elevated white blood cell count. Bmet unremarkable. Urinalysis with trace leukocytes and rare bacteria. Large amount of squamous epithelial cells on microscope which may be contaminating results. We'll send for urine culture and hold off on treating for UTI. Wet prep positive for clue cells. CT renal stone with intrarenal calculi but no obstructing calculus. No other intra-abdominal abnormality noted. Patient's symptoms controlled and emergency department with fluid bolus, morphine and Zofran. Will discharge with Flagyl. Patient does not have PCP in the area and provided with resource guide and encouraged to follow up with PCP if her symptoms do not improve. Return precautions given in discharge paperwork and discussed with pt at bedside. Pt stable for discharge  I personally performed the services described in this documentation, which was scribed in my presence. The recorded information has been reviewed and is accurate.   Josephina Gip, PA-C 08/10/15 [place], MD 08/10/15 [phone]

## 2015-08-10 NOTE — Discharge Instructions (Signed)
Follow up with a PCP at the [facility] or one from the resource guide if your symptoms do not improve.   Bacterial Vaginosis Bacterial vaginosis is a vaginal infection that occurs when the normal balance of bacteria in the vagina is disrupted. It results from an overgrowth of certain bacteria. This is the most common vaginal infection in women of childbearing age. Treatment is important to prevent complications, especially in pregnant women, as it can cause a premature delivery. CAUSES  Bacterial vaginosis is caused by an increase in harmful bacteria that are normally present in smaller amounts in the vagina. Several different kinds of bacteria can cause bacterial vaginosis. However, the reason that the condition develops is not fully understood. RISK FACTORS Certain activities or behaviors can put you at an increased risk of developing bacterial vaginosis, including:  Having a new sex partner or multiple sex partners.  Douching.  Using an intrauterine device (IUD) for contraception. Women do not get bacterial vaginosis from toilet seats, bedding, swimming pools, or contact with objects around them. SIGNS AND SYMPTOMS  Some women with bacterial vaginosis have no signs or symptoms. Common symptoms include:  Grey vaginal discharge.  A fishlike odor with discharge, especially after sexual intercourse.  Itching or burning of the vagina and vulva.  Burning or pain with urination. DIAGNOSIS  Your health care provider will take a medical history and examine the vagina for signs of bacterial vaginosis. A sample of vaginal fluid may be taken. Your health care provider will look at this sample under a microscope to check for bacteria and abnormal cells. A vaginal pH test may also be done.  TREATMENT  Bacterial vaginosis may be treated with antibiotic medicines. These may be given in the form of a pill or a vaginal cream. A second round of antibiotics may be prescribed if the condition  comes back after treatment. Because bacterial vaginosis increases your risk for sexually transmitted diseases, getting treated can help reduce your risk for chlamydia, gonorrhea, HIV, and herpes. HOME CARE INSTRUCTIONS   Only take over-the-counter or prescription medicines as directed by your health care provider.  If antibiotic medicine was prescribed, take it as directed. Make sure you finish it even if you start to feel better.  Tell all sexual partners that you have a vaginal infection. They should see their health care provider and be treated if they have problems, such as a mild rash or itching.  During treatment, it is important that you follow these instructions:  Avoid sexual activity or use condoms correctly.  Do not douche.  Avoid alcohol as directed by your health care provider.  Avoid breastfeeding as directed by your health care provider. SEEK MEDICAL CARE IF:   Your symptoms are not improving after 3 days of treatment.  You have increased discharge or pain.  You have a fever. MAKE SURE YOU:   Understand these instructions.  Will watch your condition.  Will get help right away if you are not doing well or get worse. FOR MORE INFORMATION  Centers for Disease Control and Prevention, Division of STD Prevention: [employer] American Sexual Health Association (ASHA): www.ashastd.org    This information is not intended to replace advice given to you by your health care provider. Make sure you discuss any questions you have with your health care provider.   Document Released: 06/04/2005 Document Revised: 06/25/2014 Document Reviewed: 01/14/2013 Elsevier Interactive Patient Education 2016 Elsevier Inc[employer]  Nausea, Adult Nausea is the feeling that you have an upset stomach  or have to vomit. Nausea by itself is not likely a serious concern, but it may be an early sign of more serious medical problems. As nausea gets worse, it can lead to vomiting. If vomiting develops,  there is the risk of dehydration.  CAUSES   Viral infections.  Food poisoning.  Medicines.  Pregnancy.  Motion sickness.  Migraine headaches.  Emotional distress.  Severe pain from any source.  Alcohol intoxication. HOME CARE INSTRUCTIONS  Get plenty of rest.  Ask your caregiver about specific rehydration instructions.  Eat small amounts of food and sip liquids more often.  Take all medicines as told by your caregiver. SEEK MEDICAL CARE IF:  You have not improved after 2 days, or you get worse.  You have a headache. SEEK IMMEDIATE MEDICAL CARE IF:   You have a fever.  You faint.  You keep vomiting or have blood in your vomit.  You are extremely weak or dehydrated.  You have dark or bloody stools.  You have severe chest or abdominal pain. MAKE SURE YOU:  Understand these instructions.  Will watch your condition.  Will get help right away if you are not doing well or get worse.   This information is not intended to replace advice given to you by your health care provider. Make sure you discuss any questions you have with your health care provider.   Document Released: 07/12/2004 Document Revised: 06/25/2014 Document Reviewed: 02/14/2011 Elsevier Interactive Patient Education [employer].

## 2015-08-10 NOTE — ED Notes (Signed)
Pt stable, ambulatory, states understanding of discharge instructions 

## 2015-08-11 LAB — GC/CHLAMYDIA PROBE AMP (~~LOC~~) NOT AT ARMC
CHLAMYDIA, DNA PROBE: NEGATIVE
Neisseria Gonorrhea: NEGATIVE

## 2015-09-07 ENCOUNTER — Emergency Department (HOSPITAL_COMMUNITY): Payer: Self-pay

## 2015-09-07 ENCOUNTER — Inpatient Hospital Stay (HOSPITAL_COMMUNITY)
Admission: EM | Admit: 2015-09-07 | Discharge: 2015-09-12 | DRG: 871 | Disposition: A | Discharge status: Home Health | Payer: Self-pay | Attending: Oncology | Admitting: Oncology

## 2015-09-07 ENCOUNTER — Encounter (HOSPITAL_COMMUNITY): Payer: Self-pay | Admitting: *Deleted

## 2015-09-07 DIAGNOSIS — A419 Sepsis, unspecified organism: Principal | ICD-10-CM | POA: Diagnosis present

## 2015-09-07 DIAGNOSIS — Z79899 Other long term (current) drug therapy: Secondary | ICD-10-CM

## 2015-09-07 DIAGNOSIS — E1165 Type 2 diabetes mellitus with hyperglycemia: Secondary | ICD-10-CM | POA: Diagnosis present

## 2015-09-07 DIAGNOSIS — N200 Calculus of kidney: Secondary | ICD-10-CM | POA: Diagnosis present

## 2015-09-07 DIAGNOSIS — Z794 Long term (current) use of insulin: Secondary | ICD-10-CM

## 2015-09-07 DIAGNOSIS — K219 Gastro-esophageal reflux disease without esophagitis: Secondary | ICD-10-CM | POA: Diagnosis present

## 2015-09-07 DIAGNOSIS — F17210 Nicotine dependence, cigarettes, uncomplicated: Secondary | ICD-10-CM | POA: Diagnosis present

## 2015-09-07 DIAGNOSIS — E1310 Other specified diabetes mellitus with ketoacidosis without coma: Secondary | ICD-10-CM

## 2015-09-07 DIAGNOSIS — E118 Type 2 diabetes mellitus with unspecified complications: Secondary | ICD-10-CM | POA: Insufficient documentation

## 2015-09-07 DIAGNOSIS — Z882 Allergy status to sulfonamides status: Secondary | ICD-10-CM

## 2015-09-07 DIAGNOSIS — M5412 Radiculopathy, cervical region: Secondary | ICD-10-CM

## 2015-09-07 DIAGNOSIS — F329 Major depressive disorder, single episode, unspecified: Secondary | ICD-10-CM | POA: Diagnosis present

## 2015-09-07 DIAGNOSIS — F32A Depression, unspecified: Secondary | ICD-10-CM | POA: Diagnosis present

## 2015-09-07 DIAGNOSIS — N12 Tubulo-interstitial nephritis, not specified as acute or chronic: Secondary | ICD-10-CM

## 2015-09-07 DIAGNOSIS — Z8541 Personal history of malignant neoplasm of cervix uteri: Secondary | ICD-10-CM

## 2015-09-07 DIAGNOSIS — E785 Hyperlipidemia, unspecified: Secondary | ICD-10-CM

## 2015-09-07 DIAGNOSIS — IMO0002 Reserved for concepts with insufficient information to code with codable children: Secondary | ICD-10-CM | POA: Diagnosis present

## 2015-09-07 DIAGNOSIS — F419 Anxiety disorder, unspecified: Secondary | ICD-10-CM | POA: Diagnosis present

## 2015-09-07 DIAGNOSIS — G8929 Other chronic pain: Secondary | ICD-10-CM | POA: Diagnosis present

## 2015-09-07 DIAGNOSIS — Z90710 Acquired absence of both cervix and uterus: Secondary | ICD-10-CM

## 2015-09-07 DIAGNOSIS — N10 Acute tubulo-interstitial nephritis: Secondary | ICD-10-CM

## 2015-09-07 DIAGNOSIS — F418 Other specified anxiety disorders: Secondary | ICD-10-CM

## 2015-09-07 DIAGNOSIS — B965 Pseudomonas (aeruginosa) (mallei) (pseudomallei) as the cause of diseases classified elsewhere: Secondary | ICD-10-CM | POA: Diagnosis present

## 2015-09-07 DIAGNOSIS — G4700 Insomnia, unspecified: Secondary | ICD-10-CM | POA: Diagnosis present

## 2015-09-07 LAB — GLUCOSE, CAPILLARY
GLUCOSE-CAPILLARY: 201 mg/dL — AB (ref 65–99)
GLUCOSE-CAPILLARY: 228 mg/dL — AB (ref 65–99)
Glucose-Capillary: 317 mg/dL — ABNORMAL HIGH (ref 65–99)
Glucose-Capillary: 160 mg/dL — ABNORMAL HIGH (ref 65–99)
Glucose-Capillary: 117 mg/dL — ABNORMAL HIGH (ref 65–99)
Glucose-Capillary: 157 mg/dL — ABNORMAL HIGH (ref 65–99)

## 2015-09-07 LAB — BASIC METABOLIC PANEL
ANION GAP: 13 (ref 5–15)
Anion gap: 16 — ABNORMAL HIGH (ref 5–15)
BUN: 12 mg/dL (ref 6–20)
BUN: 16 mg/dL (ref 6–20)
CALCIUM: 8.9 mg/dL (ref 8.9–10.3)
CALCIUM: 8.9 mg/dL (ref 8.9–10.3)
CO2: 22 mmol/L (ref 22–32)
CO2: 22 mmol/L (ref 22–32)
CREATININE: 0.79 mg/dL (ref 0.44–1.00)
Chloride: 97 mmol/L — ABNORMAL LOW (ref 101–111)
Chloride: 97 mmol/L — ABNORMAL LOW (ref 101–111)
Creatinine, Ser: 0.71 mg/dL (ref 0.44–1.00)
GFR calc non Af Amer: >60 mL/min (ref >60)
GLUCOSE: 322 mg/dL — AB (ref 65–99)
Glucose, Bld: 238 mg/dL — ABNORMAL HIGH (ref 65–99)
Potassium: 3.6 mmol/L (ref 3.5–5.1)
Potassium: 3.5 mmol/L (ref 3.5–5.1)
SODIUM: 132 mmol/L — AB (ref 135–145)
Sodium: 135 mmol/L (ref 135–145)

## 2015-09-07 LAB — COMPREHENSIVE METABOLIC PANEL
ALBUMIN: 4 g/dL (ref 3.5–5.0)
ALT: 30 U/L (ref 14–54)
ANION GAP: 17 — AB (ref 5–15)
AST: 23 U/L (ref 15–41)
Alkaline Phosphatase: 100 U/L (ref 38–126)
BILIRUBIN TOTAL: 1.2 mg/dL (ref 0.3–1.2)
BUN: 12 mg/dL (ref 6–20)
CO2: 20 mmol/L — ABNORMAL LOW (ref 22–32)
Calcium: 10.4 mg/dL — ABNORMAL HIGH (ref 8.9–10.3)
Chloride: 91 mmol/L — ABNORMAL LOW (ref 101–111)
Creatinine, Ser: 0.94 mg/dL (ref 0.44–1.00)
GFR calc non Af Amer: >60 mL/min (ref >60)
GLUCOSE: 432 mg/dL — AB (ref 65–99)
POTASSIUM: 4.1 mmol/L (ref 3.5–5.1)
SODIUM: 128 mmol/L — AB (ref 135–145)
TOTAL PROTEIN: 8 g/dL (ref 6.5–8.1)

## 2015-09-07 LAB — URINE MICROSCOPIC-ADD ON

## 2015-09-07 LAB — CBC
HEMATOCRIT: 43.3 % (ref 36.0–46.0)
HEMOGLOBIN: 15.8 g/dL — AB (ref 12.0–15.0)
MCH: 32.8 pg (ref 26.0–34.0)
MCHC: 36.5 g/dL — AB (ref 30.0–36.0)
MCV: 89.8 fL (ref 78.0–100.0)
Platelets: 255 10*3/uL (ref 150–400)
RBC: 4.82 MIL/uL (ref 3.87–5.11)
RDW: 12.1 % (ref 11.5–15.5)
WBC: 22.6 10*3/uL — ABNORMAL HIGH (ref 4.0–10.5)

## 2015-09-07 LAB — URINALYSIS, ROUTINE W REFLEX MICROSCOPIC
BILIRUBIN URINE: NEGATIVE
Glucose, UA: >1000 mg/dL — AB
Ketones, ur: 40 mg/dL — AB
NITRITE: POSITIVE — AB
PH: 5.5 (ref 5.0–8.0)
Protein, ur: 100 mg/dL — AB
SPECIFIC GRAVITY, URINE: 1.039 — AB (ref 1.005–1.030)

## 2015-09-07 LAB — I-STAT CG4 LACTIC ACID, ED: LACTIC ACID, VENOUS: 3.57 mmol/L — AB (ref 0.5–2.0)

## 2015-09-07 LAB — LACTIC ACID, PLASMA: Lactic Acid, Venous: 2 mmol/L (ref 0.5–2.0)

## 2015-09-07 LAB — LIPASE, BLOOD: Lipase: 51 U/L (ref 11–51)

## 2015-09-07 MED ORDER — DEXTROSE-NACL 5-0.45 % IV SOLN
INTRAVENOUS | Status: DC
  Administered 2015-09-07: 20:00:00 via INTRAVENOUS
  Administered 2015-09-08: 100 mL via INTRAVENOUS
  Administered 2015-09-09: 01:00:00 via INTRAVENOUS

## 2015-09-07 MED ORDER — SODIUM CHLORIDE 0.9 % IV SOLN
INTRAVENOUS | Status: DC
  Administered 2015-09-07: 0.6 [IU]/h via INTRAVENOUS
  Filled 2015-09-07: qty 2.5

## 2015-09-07 MED ORDER — ONDANSETRON HCL 4 MG PO TABS
4.0000 mg | ORAL_TABLET | Freq: Four times a day (QID) | ORAL | Status: DC | PRN
  Administered 2015-09-08 – 2015-09-09 (×2): 4 mg via ORAL
  Filled 2015-09-07 (×2): qty 1

## 2015-09-07 MED ORDER — ACETAMINOPHEN-CODEINE #3 300-30 MG PO TABS
1.0000 | ORAL_TABLET | Freq: Three times a day (TID) | ORAL | Status: DC | PRN
Start: 2015-09-07 — End: 2015-09-11
  Administered 2015-09-07 – 2015-09-08 (×2): 1 via ORAL
  Filled 2015-09-07 (×2): qty 1

## 2015-09-07 MED ORDER — SODIUM CHLORIDE 0.9 % IV SOLN
INTRAVENOUS | Status: DC
  Administered 2015-09-07: 17:00:00 via INTRAVENOUS

## 2015-09-07 MED ORDER — INSULIN ASPART 100 UNIT/ML ~~LOC~~ SOLN: 0.0000 [IU] | Freq: Three times a day (TID) | SUBCUTANEOUS | Status: DC

## 2015-09-07 MED ORDER — LIDOCAINE 5 % EX PTCH
1.0000 | MEDICATED_PATCH | CUTANEOUS | Status: DC
  Administered 2015-09-07 – 2015-09-12 (×6): 1 via TRANSDERMAL
  Filled 2015-09-07 (×6): qty 1

## 2015-09-07 MED ORDER — POTASSIUM CHLORIDE 10 MEQ/100ML IV SOLN
10.0000 meq | INTRAVENOUS | Status: AC
  Administered 2015-09-07 (×2): 10 meq via INTRAVENOUS
  Filled 2015-09-07 (×2): qty 100

## 2015-09-07 MED ORDER — ONDANSETRON 4 MG PO TBDP
ORAL_TABLET | ORAL | Status: AC
  Filled 2015-09-07: qty 1

## 2015-09-07 MED ORDER — ONDANSETRON HCL 4 MG/2ML IJ SOLN
4.0000 mg | Freq: Four times a day (QID) | INTRAMUSCULAR | Status: DC | PRN
  Administered 2015-09-07 – 2015-09-08 (×4): 4 mg via INTRAVENOUS
  Filled 2015-09-07 (×4): qty 2

## 2015-09-07 MED ORDER — GABAPENTIN 300 MG PO CAPS
300.0000 mg | ORAL_CAPSULE | Freq: Two times a day (BID) | ORAL | Status: DC
  Administered 2015-09-07 – 2015-09-12 (×10): 300 mg via ORAL
  Filled 2015-09-07 (×11): qty 1

## 2015-09-07 MED ORDER — ONDANSETRON HCL 4 MG/2ML IJ SOLN
4.0000 mg | Freq: Once | INTRAMUSCULAR | Status: AC
  Administered 2015-09-07: 4 mg via INTRAVENOUS
  Filled 2015-09-07: qty 2

## 2015-09-07 MED ORDER — ENOXAPARIN SODIUM 40 MG/0.4ML ~~LOC~~ SOLN
40.0000 mg | SUBCUTANEOUS | Status: DC
  Administered 2015-09-07 – 2015-09-11 (×5): 40 mg via SUBCUTANEOUS
  Filled 2015-09-07 (×6): qty 0.4

## 2015-09-07 MED ORDER — CEFTRIAXONE SODIUM 1 G IJ SOLR
1.0000 g | INTRAMUSCULAR | Status: DC
  Filled 2015-09-07: qty 10

## 2015-09-07 MED ORDER — MORPHINE SULFATE (PF) 4 MG/ML IV SOLN
4.0000 mg | Freq: Once | INTRAVENOUS | Status: AC
  Administered 2015-09-07: 4 mg via INTRAVENOUS
  Filled 2015-09-07: qty 1

## 2015-09-07 MED ORDER — MORPHINE SULFATE (PF) 2 MG/ML IV SOLN
1.0000 mg | INTRAVENOUS | Status: AC | PRN
  Administered 2015-09-07 – 2015-09-08 (×6): 1 mg via INTRAVENOUS
  Filled 2015-09-07 (×7): qty 1

## 2015-09-07 MED ORDER — ACETAMINOPHEN 650 MG RE SUPP: 650.0000 mg | Freq: Four times a day (QID) | RECTAL | Status: DC | PRN

## 2015-09-07 MED ORDER — ONDANSETRON 4 MG PO TBDP
4.0000 mg | ORAL_TABLET | Freq: Once | ORAL | Status: AC | PRN
  Administered 2015-09-07: 4 mg via ORAL

## 2015-09-07 MED ORDER — DEXTROSE 5 % IV SOLN
2.0000 g | Freq: Once | INTRAVENOUS | Status: AC
  Administered 2015-09-07: 2 g via INTRAVENOUS
  Filled 2015-09-07: qty 2

## 2015-09-07 MED ORDER — PRAVASTATIN SODIUM 20 MG PO TABS
10.0000 mg | ORAL_TABLET | Freq: Every day | ORAL | Status: DC
  Administered 2015-09-07: 10 mg via ORAL
  Administered 2015-09-08: 20 mg via ORAL
  Administered 2015-09-09 – 2015-09-11 (×3): 10 mg via ORAL
  Filled 2015-09-07 (×5): qty 1

## 2015-09-07 MED ORDER — FLUOXETINE HCL 10 MG PO CAPS
10.0000 mg | ORAL_CAPSULE | Freq: Every day | ORAL | Status: DC
  Administered 2015-09-07 – 2015-09-11 (×5): 10 mg via ORAL
  Filled 2015-09-07 (×6): qty 1

## 2015-09-07 MED ORDER — TRAZODONE HCL 100 MG PO TABS: 100.0000 mg | ORAL_TABLET | Freq: Every evening | ORAL | Status: DC | PRN

## 2015-09-07 MED ORDER — INSULIN ASPART 100 UNIT/ML ~~LOC~~ SOLN: 0.0000 [IU] | Freq: Every day | SUBCUTANEOUS | Status: DC

## 2015-09-07 MED ORDER — KETOROLAC TROMETHAMINE 15 MG/ML IJ SOLN
15.0000 mg | Freq: Four times a day (QID) | INTRAMUSCULAR | Status: DC | PRN
  Administered 2015-09-07: 15 mg via INTRAVENOUS
  Filled 2015-09-07: qty 1

## 2015-09-07 MED ORDER — KCL IN DEXTROSE-NACL 20-5-0.45 MEQ/L-%-% IV SOLN
INTRAVENOUS | Status: DC
  Filled 2015-09-07 (×2): qty 1000

## 2015-09-07 MED ORDER — POTASSIUM CHLORIDE CRYS ER 20 MEQ PO TBCR
40.0000 meq | EXTENDED_RELEASE_TABLET | Freq: Once | ORAL | Status: AC
  Administered 2015-09-07: 40 meq via ORAL
  Filled 2015-09-07: qty 2

## 2015-09-07 MED ORDER — ALPRAZOLAM 0.25 MG PO TABS
0.2500 mg | ORAL_TABLET | Freq: Every evening | ORAL | Status: DC | PRN
  Administered 2015-09-07 – 2015-09-11 (×5): 0.25 mg via ORAL
  Filled 2015-09-07 (×5): qty 1

## 2015-09-07 MED ORDER — SODIUM CHLORIDE 0.9 % IV BOLUS (SEPSIS)
1000.0000 mL | INTRAVENOUS | Status: AC
  Administered 2015-09-07 (×2): 1000 mL via INTRAVENOUS

## 2015-09-07 MED ORDER — SODIUM CHLORIDE 0.9 % IV SOLN
INTRAVENOUS | Status: DC
  Administered 2015-09-09: 100 mL via INTRAVENOUS

## 2015-09-07 MED ORDER — INSULIN ASPART 100 UNIT/ML ~~LOC~~ SOLN
15.0000 [IU] | Freq: Once | SUBCUTANEOUS | Status: AC
  Administered 2015-09-07: 15 [IU] via SUBCUTANEOUS

## 2015-09-07 MED ORDER — ACETAMINOPHEN 325 MG PO TABS: 650.0000 mg | ORAL_TABLET | Freq: Four times a day (QID) | ORAL | Status: DC | PRN

## 2015-09-07 NOTE — ED Notes (Signed)
Pt actively vomiting.

## 2015-09-07 NOTE — Progress Notes (Signed)
Pharmacy Code Sepsis Protocol  Time of code sepsis page: 1327  [x]  Antibiotics delivered at 1343 []  Antibiotics administered prior to code at  (if checked, omit next 2 questions)  Were antibiotics ordered at the time of the code sepsis page? Yes Was it required to contact the physician? [x]  Physician not contacted []  Physician contacted to order antibiotics for code sepsis []  Physician contacted to recommend changing antibiotics  Pharmacy consulted for: N/A  Anti-infectives    Start     Dose/Rate Route Frequency Ordered Stop   09/08/15 1400  cefTRIAXone (ROCEPHIN) 1 g in dextrose 5 % 50 mL IVPB     1 g 100 mL/hr over 30 Minutes Intravenous Every 24 hours 09/07/15 1339     09/07/15 1330  cefTRIAXone (ROCEPHIN) 2 g in dextrose 5 % 50 mL IVPB     2 g 100 mL/hr over 30 Minutes Intravenous  Once 09/07/15 1322          Nurse education provided: []  Minutes left to administer antibiotics to achieve 1 hour goal []  Correct order of antibiotic administration []  Antibiotic Y-site compatibilities     Emmalene Kattner, Rande Lawman, PharmD 09/07/2015, 1:44 PM

## 2015-09-07 NOTE — ED Provider Notes (Signed)
CSN: [id number]     Arrival date & time 09/07/15  2637 History   First MD Initiated Contact with Patient 09/07/15 1255     Chief Complaint  Patient presents with  . Abdominal Pain  . Back Pain     (Consider location/radiation/quality/duration/timing/severity/associated sxs/prior Treatment) HPI Comments: Patient with a history of diabetes, pyelonephritis and kidney stones, chronic neck pain, and past uterine cancer presents with abdominal back pain. She reports a three-day history of worsening pain across her abdomen and across her low back bilaterally. She states it hurts when she urinates. She's had fever up to 102 at home. She's had nausea and vomiting and is unable to keep anything down. 2 little bit of cough. There is no shortness of breath. She states she's had multiple kidney stones in the past. She's passed some on her own and she's had to have surgery for others. She's taking over-the-counter medicines without improvement in symptoms. She's followed at the [facility].   Past Medical History  Diagnosis Date  . Anxiety   . Insomnia   . Tobacco abuse   . Pyelonephritis     Archie Endo 04/25/2015  . Chronic bronchitis ([place])   . GERD (gastroesophageal reflux disease)   . Chronic lower back pain   . Uterine cancer [facility])    Past Surgical History  Procedure Laterality Date  . Foot fracture surgery Right 2015  . Fracture surgery    . Abdominal hysterectomy  1980's  . Tubal ligation  1983   History reviewed. No pertinent family history. Social History  Substance Use Topics  . Smoking status: Current Every Day Smoker -- 0.50 packs/day for 34 years    Types: Cigarettes  . Smokeless tobacco: Never Used  . Alcohol Use: No   OB History    No data available     Review of Systems  Constitutional: Positive for fever and fatigue. Negative for chills and diaphoresis.  HENT: Negative for congestion, rhinorrhea and sneezing.   Eyes: Negative.   Respiratory: Positive  for cough. Negative for chest tightness and shortness of breath.   Cardiovascular: Negative for chest pain and leg swelling.  Gastrointestinal: Positive for nausea, vomiting and abdominal pain. Negative for diarrhea and blood in stool.  Genitourinary: Negative for frequency, hematuria, flank pain and difficulty urinating.  Musculoskeletal: Positive for back pain. Negative for arthralgias.  Skin: Negative for rash.  Neurological: Negative for dizziness, speech difficulty, weakness, numbness and headaches.      Allergies  Levaquin and Sulfa antibiotics  Home Medications   Prior to Admission medications   Medication Sig Start Date End Date Taking? Authorizing Provider  acetaminophen-codeine (TYLENOL #3) 300-30 MG tablet Take 1 tablet by mouth every 8 (eight) hours as needed for moderate pain. 05/31/15   Arnoldo Morale, MD  ALPRAZolam (XANAX) 0.25 MG tablet Take 1 tablet (0.25 mg total) by mouth at bedtime as needed (anxiety). 04/28/15   Ripudeep Krystal Eaton, MD  Aspirin-Salicylamide-Caffeine (BC FAST PAIN RELIEF) [phone] MG PACK Take 1 Package by mouth daily as needed (for pain).    Historical Provider, MD  blood glucose meter kit and supplies KIT Dispense based on patient and insurance preference. Use up to four times daily as directed. (FOR ICD-9 250.00, 250.01). Patient not taking: Reported on 05/31/2015 04/28/15   Ripudeep Krystal Eaton, MD  Blood Glucose Monitoring Suppl (TRUE METRIX METER) DEVI 1 each by Does not apply route 3 (three) times daily before meals. 05/31/15   Arnoldo Morale, MD  FLUoxetine (PROZAC) 10 MG capsule Take 1 capsule (10 mg total) by mouth daily. Patient taking differently: Take 10 mg by mouth at bedtime.  05/31/15   Arnoldo Morale, MD  gabapentin (NEURONTIN) 300 MG capsule Take 1 capsule (300 mg total) by mouth 2 (two) times daily. 05/31/15   Arnoldo Morale, MD  glimepiride (AMARYL) 4 MG tablet Take 1 tablet (4 mg total) by mouth daily with breakfast. 05/31/15   Arnoldo Morale, MD   glucose blood (TRUE METRIX BLOOD GLUCOSE TEST) test strip Used 3 times daily before meals 05/31/15   Arnoldo Morale, MD  HYDROcodone-acetaminophen (NORCO/VICODIN) 5-325 MG tablet Take 2 tablets by mouth every 4 (four) hours as needed. 07/08/15   Fransico Meadow, PA-C  insulin regular (NOVOLIN R) 100 units/mL injection Correction scale prior to meals three times a day 121-150- 2 units,  151-200- 3 units,  201-250- 5 units,  251-300- 8 units,  301-350- 11 units, 351-400- 15 units, > 400- 15 units and call MD  Bedtime correction scale: 201-250-2 units,  251-300-3 units,  301-350-4 units, 351-400-5 units, >400- 5 units and call MD 04/28/15   Ripudeep Krystal Eaton, MD  insulin starter kit- syringes MISC 1 kit by Other route once. 04/28/15   Ripudeep Krystal Eaton, MD  Insulin Syringes, Disposable, U-100 0.5 ML MISC Type 2 diabetes mellitus 04/28/15   Ripudeep K Rai, MD  lidocaine (LIDODERM) 5 % Place 1 patch onto the skin daily. Remove & Discard patch within 12 hours or as directed by MD Patient taking differently: Place 1 patch onto the skin daily as needed (for lower back). Remove & Discard patch within 12 hours or as directed by MD 04/28/15   Ripudeep Krystal Eaton, MD  metFORMIN (GLUCOPHAGE) 1000 MG tablet Take 1 tablet (1,000 mg total) by mouth 2 (two) times daily with a meal. 05/31/15   Arnoldo Morale, MD  metroNIDAZOLE (FLAGYL) 500 MG tablet Take 1 tablet (500 mg total) by mouth 2 (two) times daily. 08/10/15   Stevi Barrett, PA-C  ondansetron (ZOFRAN) 4 MG tablet Take 1 tablet (4 mg total) by mouth every 6 (six) hours. 08/10/15   Stevi Barrett, PA-C  pravastatin (PRAVACHOL) 10 MG tablet Take 1 tablet (10 mg total) by mouth at bedtime. 05/31/15   Arnoldo Morale, MD  promethazine (PHENERGAN) 12.5 MG tablet Take 1 tablet (12.5 mg total) by mouth every 6 (six) hours as needed for nausea or vomiting. 04/28/15   Ripudeep Krystal Eaton, MD  traZODone (DESYREL) 100 MG tablet Take 1 tablet (100 mg total) by mouth at bedtime as needed for sleep.  05/31/15   Arnoldo Morale, MD  TRUEPLUS LANCETS 28G MISC 1 each by Does not apply route 3 (three) times daily before meals. 05/31/15   Arnoldo Morale, MD   BP 141/78 mmHg  Pulse 116  Temp(Src) 99.9 F (37.7 C) (Oral)  Resp 18  SpO2 100% Physical Exam  Constitutional: She is oriented to person, place, and time. She appears well-developed and well-nourished.  HENT:  Head: Normocephalic and atraumatic.  Dry mucous membranes  Eyes: Pupils are equal, round, and reactive to light.  Neck: Normal range of motion. Neck supple.  Cardiovascular: Normal rate, regular rhythm and normal heart sounds.   Pulmonary/Chest: Effort normal and breath sounds normal. No respiratory distress. She has no wheezes. She has no rales. She exhibits no tenderness.  Abdominal: Soft. Bowel sounds are normal. There is tenderness (moderate diffuse tenderness, bilateral CVA tenderness). There is no rebound and no guarding.  Musculoskeletal: Normal  range of motion. She exhibits no edema.  Lymphadenopathy:    She has no cervical adenopathy.  Neurological: She is alert and oriented to person, place, and time.  Skin: Skin is warm and dry. No rash noted.  Psychiatric: She has a normal mood and affect.    ED Course  Procedures (including critical care time) Labs Review Labs Reviewed  COMPREHENSIVE METABOLIC PANEL - Abnormal; Notable for the following:    Sodium 128 (*)    Chloride 91 (*)    CO2 20 (*)    Glucose, Bld 432 (*)    Calcium 10.4 (*)    Anion gap 17 (*)    All other components within normal limits  CBC - Abnormal; Notable for the following:    WBC 22.6 (*)    Hemoglobin 15.8 (*)    MCHC 36.5 (*)    All other components within normal limits  URINALYSIS, ROUTINE W REFLEX MICROSCOPIC (NOT AT [facility]) - Abnormal; Notable for the following:    APPearance CLOUDY (*)    Specific Gravity, Urine 1.039 (*)    Glucose, UA >1000 (*)    Hgb urine dipstick MODERATE (*)    Ketones, ur 40 (*)    Protein, ur 100 (*)     Nitrite POSITIVE (*)    Leukocytes, UA SMALL (*)    All other components within normal limits  URINE MICROSCOPIC-ADD ON - Abnormal; Notable for the following:    Squamous Epithelial / LPF 0-5 (*)    Bacteria, UA MANY (*)    All other components within normal limits  I-STAT CG4 LACTIC ACID, ED - Abnormal; Notable for the following:    Lactic Acid, Venous 3.57 (*)    All other components within normal limits  CULTURE, BLOOD (ROUTINE X 2)  CULTURE, BLOOD (ROUTINE X 2)  URINE CULTURE  LIPASE, BLOOD    Imaging Review Dg Chest 2 View  09/07/2015  CLINICAL DATA:  Elevated blood sugar and cough, history of tobacco use EXAM: CHEST  2 VIEW COMPARISON:  07/08/2015 FINDINGS: The heart size and mediastinal contours are within normal limits. Both lungs are clear. The visualized skeletal structures are unremarkable. IMPRESSION: No active cardiopulmonary disease. Electronically Signed   By: Inez Catalina M.D.   On: 09/07/2015 13:35   I have personally reviewed and evaluated these images and lab results as part of my medical decision-making.   EKG Interpretation   Date/Time:  Wednesday September 07 2015 13:58:32 EDT Ventricular Rate:  103 PR Interval:  169 QRS Duration: 93 QT Interval:  366 QTC Calculation: 479 R Axis:   -37 Text Interpretation:  Sinus tachycardia Probable left atrial enlargement  Left axis deviation SINCE LAST TRACING HEART RATE HAS INCREASED Confirmed  by Tavita Eastham  MD, Dedra Matsuo ([id number]) on 09/07/2015 2:03:49 PM      MDM   Final diagnoses:  Sepsis, due to unspecified organism [facility])  Pyelonephritis    Patient presents with abdominal back pain. Her urine is concerning for infection. Her white count is markedly elevated at 22,000. Her lactate is also elevated indicating severe sepsis. Her blood pressures been stable. She has been tachycardic. She was given antibiotics and IV fluids per sepsis protocol. Her chest x-ray is clear without evidence of pneumonia. Blood and urine cultures  were sent. She's currently leaving a CT of the abdomen and pelvis to rule out an infected kidney stone. I will consult unassigned medicine for admission.  Spoke with IM teaching service who will admit pt to med-surg bed  CRITICAL CARE Performed by: Tyanna Hach Total critical care time: 40 minutes Critical care time was exclusive of separately billable procedures and treating other patients. Critical care was necessary to treat or prevent imminent or life-threatening deterioration. Critical care was time spent personally by me on the following activities: development of treatment plan with patient and/or surrogate as well as nursing, discussions with consultants, evaluation of patient's response to treatment, examination of patient, obtaining history from patient or surrogate, ordering and performing treatments and interventions, ordering and review of laboratory studies, ordering and review of radiographic studies, pulse oximetry and re-evaluation of patient's condition.     Malvin Johns, MD 09/07/15 [phone]

## 2015-09-07 NOTE — ED Notes (Signed)
Patient transported to CT 

## 2015-09-07 NOTE — H&P (Signed)
Date: 09/07/2015               Patient Name:  Bethany Mccarthy MRN: [id number]  DOB: 05/30/1966 Age / Sex: 50 y.o., female   PCP: No Pcp Per Patient         Medical Service: Internal Medicine Teaching Service         Attending Physician: Dr. Annia Belt, MD    First Contact: Andi Devon, MS3 Pager: [phone]  Second Contact: Third Contact: Dr. Zada Finders Dr. Jacques Earthly Pager: Pager: [phone]        After Hours (After 5p/  First Contact Pager: [phone]  weekends / holidays): Second Contact Pager: [phone]   Chief Complaint: Back pain  History of Present Illness: Ms. Bethany Mccarthy is a 50 year old female with PMH of T2DM, HLD, C5-C6 radiculopathy, cervical cancer s/p hysterectomy and oopherectomy, renal stones, and pyelonephritis who presents with back pain. She says the pain began 4 days ago and radiates to her abdomen, described as a burning, "fluttering" sensation that is 10/10 on intensity. She says the pain is worse with walking and improves when she is lying down with her legs folded so that her knees are up. She has tried [employer] powders and Pepto-bismal without relief. She says she has had numerous kidney stones in the past requiring lithotripsy. She was recently hospitalized from November 7th-10th  2016 for bilateral pyelonephritis at which time urine culture grew E coli. She was treated with IV Ceftriaxone and transitioned to oral Keflex. She says her current pain feels more like her prior kidney stones than her prior kidney infection. She has associated dysuria, nausea, vomiting, subjective fever, headache, myalgias, chills, urinary frequency, increased thirst. She denies chest pain, SOB, diarrhea, or diaphoresis.  At previous hospitalization, patient was newly diagnosed with T2DM with a Hgb A1C of 10.1. She reports taking Metformin 1000 mg BID and sliding scale insulin. She was prescribed Glimepiride as well, but she is unsure if she is taking this. She checks  her blood sugars at home and reports usual numbers around 140.  She is a current smoker of 3 cigarettes a day, down from 1 PPD since age of 48. She denies alcohol or illicit drug use. She denies any sick contacts. She reports a family history of kidney stones in her paternal aunts and a paternal aunt who died of lung cancer. She reports allergies to Levaquin and Sulfa drugs which cause her to break out in hives.  In the ED, vitals were: BP 141/78 mmHg  Pulse 116  Temp(Src) 99.9 F (37.7 C) (Oral)  Resp 18  SpO2 100% Urine showed positive nitrites, small leukocytes, many bacteria, ketones, and numerous WBCs. She had a leukocytosis of 22,000 and lactic acid of 3.57. She was given two 1 L boluses of NS and 2 grams IV Ceftriaxone. CT renal stone study showed bilateral nonobstructive nephrolithiasis which was noted on previous study last month. Also shows minimal left perinephric stranding and periureteral stranding. Small amount of air is noted in the anterior bladder.   Meds: Current Facility-Administered Medications  Medication Dose Route Frequency Provider Last Rate Last Dose  . 0.9 %  sodium chloride infusion   Intravenous Continuous Milagros Loll, MD 100 mL/hr at 09/07/15 1642    . acetaminophen (TYLENOL) tablet 650 mg  650 mg Oral Q6H PRN Milagros Loll, MD       Or  . acetaminophen (TYLENOL) suppository 650 mg  650 mg Rectal Q6H PRN Anderson Malta  Pixie Casino, MD      . acetaminophen-codeine (TYLENOL #3) 300-30 MG per tablet 1 tablet  1 tablet Oral Q8H PRN Milagros Loll, MD   1 tablet at 09/07/15 1643  . ALPRAZolam (XANAX) tablet 0.25 mg  0.25 mg Oral QHS PRN Milagros Loll, MD      . Derrill Memo ON 09/08/2015] cefTRIAXone (ROCEPHIN) 1 g in dextrose 5 % 50 mL IVPB  1 g Intravenous Q24H Rachel L Rumbarger, RPH      . enoxaparin (LOVENOX) injection 40 mg  40 mg Subcutaneous Q24H Milagros Loll, MD   40 mg at 09/07/15 1646  . FLUoxetine (PROZAC) capsule 10 mg  10 mg Oral QHS Milagros Loll, MD       . gabapentin (NEURONTIN) capsule 300 mg  300 mg Oral BID Milagros Loll, MD      . ketorolac (TORADOL) 15 MG/ML injection 15 mg  15 mg Intravenous Q6H PRN Milagros Loll, MD   15 mg at 09/07/15 1536  . lidocaine (LIDODERM) 5 % 1 patch  1 patch Transdermal Q24H Milagros Loll, MD   1 patch at 09/07/15 1644  . morphine 2 MG/ML injection 1 mg  1 mg Intravenous Q3H PRN Milagros Loll, MD      . ondansetron [facility]) tablet 4 mg  4 mg Oral Q6H PRN Milagros Loll, MD       Or  . ondansetron [facility]) injection 4 mg  4 mg Intravenous Q6H PRN Milagros Loll, MD   4 mg at 09/07/15 1643  . pravastatin (PRAVACHOL) tablet 10 mg  10 mg Oral QHS Milagros Loll, MD      . traZODone (DESYREL) tablet 100 mg  100 mg Oral QHS PRN Milagros Loll, MD        Allergies: Allergies as of 09/07/2015 - Review Complete 09/07/2015  Allergen Reaction Noted  . Levaquin [levofloxacin in d5w] Hives 04/25/2015  . Sulfa antibiotics Hives 04/25/2015   Past Medical History  Diagnosis Date  . Anxiety   . Insomnia   . Tobacco abuse   . Pyelonephritis     Archie Endo 04/25/2015  . Chronic bronchitis ([place])   . GERD (gastroesophageal reflux disease)   . Chronic lower back pain   . Uterine cancer [facility])    Past Surgical History  Procedure Laterality Date  . Foot fracture surgery Right 2015  . Fracture surgery    . Abdominal hysterectomy  1980's  . Tubal ligation  1983   History reviewed. No pertinent family history. Social History   Social History  . Marital Status: Divorced    Spouse Name: N/A  . Number of Children: N/A  . Years of Education: N/A   Occupational History  . Not on file.   Social History Main Topics  . Smoking status: Current Every Day Smoker -- 0.50 packs/day for 34 years    Types: Cigarettes  . Smokeless tobacco: Never Used  . Alcohol Use: No  . Drug Use: No  . Sexual Activity: Not Currently   Other Topics Concern  . Not on file   Social History Narrative    Review of  Systems: Review of Systems  Constitutional: Positive for fever, chills and malaise/fatigue. Negative for diaphoresis.  Eyes: Negative for blurred vision.  Respiratory: Positive for cough. Negative for hemoptysis, sputum production, shortness of breath and wheezing.   Cardiovascular: Positive for palpitations. Negative for chest pain, orthopnea and leg swelling.  Gastrointestinal: Positive for nausea, vomiting  and abdominal pain. Negative for diarrhea and constipation.  Genitourinary: Positive for dysuria, frequency and flank pain. Negative for hematuria.  Musculoskeletal: Positive for myalgias and neck pain. Negative for falls.  Neurological: Positive for tingling and headaches.  Psychiatric/Behavioral: Negative for substance abuse.     Physical Exam: Blood pressure 156/86, pulse 94, temperature 98.9 F (37.2 C), temperature source Oral, resp. rate 16, height 5\' 6"  (1.676 m), weight 140 lb (63.504 kg), SpO2 98 %. Physical Exam  Constitutional: She is oriented to person, place, and time. She appears well-developed and well-nourished.  Resting in bed, in mild distress.  HENT:  Head: Normocephalic and atraumatic.  Eyes: EOM are normal.  Cardiovascular: Regular rhythm and intact distal pulses.  Tachycardia present.   Pulmonary/Chest: Effort normal and breath sounds normal. No respiratory distress. She has no wheezes. She has no rales. She exhibits no tenderness.  Abdominal: Soft. Bowel sounds are decreased. There is generalized tenderness. There is CVA tenderness.  Pronounced bilateral CVA tenderness  Musculoskeletal: Normal range of motion. She exhibits no edema or tenderness.  Neurological: She is alert and oriented to person, place, and time.  Skin: Skin is warm and dry. She is not diaphoretic.  Xanthelasmas bilateral eyelids  Psychiatric: She has a normal mood and affect.     Lab results: Basic Metabolic Panel:  Recent Labs  09/07/15 1019 09/07/15 1450  NA 128* 132*  K 4.1  3.6  CL 91* 97*  CO2 20* 22  GLUCOSE 432* 322*  BUN 12 12  CREATININE 0.94 0.71  CALCIUM 10.4* 8.9   Liver Function Tests:  Recent Labs  09/07/15 1019  AST 23  ALT 30  ALKPHOS 100  BILITOT 1.2  PROT 8.0  ALBUMIN 4.0    Recent Labs  09/07/15 1019  LIPASE 51   No results for input(s): AMMONIA in the last 72 hours. CBC:  Recent Labs  09/07/15 1019  WBC 22.6*  HGB 15.8*  HCT 43.3  MCV 89.8  PLT 255   Cardiac Enzymes: No results for input(s): CKTOTAL, CKMB, CKMBINDEX, TROPONINI in the last 72 hours. BNP: No results for input(s): PROBNP in the last 72 hours. D-Dimer: No results for input(s): DDIMER in the last 72 hours. CBG:  Recent Labs  09/07/15 1649  GLUCAP 317*   Hemoglobin A1C: No results for input(s): HGBA1C in the last 72 hours. Fasting Lipid Panel: No results for input(s): CHOL, HDL, LDLCALC, TRIG, CHOLHDL, LDLDIRECT in the last 72 hours. Thyroid Function Tests: No results for input(s): TSH, T4TOTAL, FREET4, T3FREE, THYROIDAB in the last 72 hours. Anemia Panel: No results for input(s): VITAMINB12, FOLATE, FERRITIN, TIBC, IRON, RETICCTPCT in the last 72 hours. Coagulation: No results for input(s): LABPROT, INR in the last 72 hours. Urine Drug Screen: Drugs of Abuse  No results found for: LABOPIA, COCAINSCRNUR, LABBENZ, AMPHETMU, THCU, LABBARB  Alcohol Level: No results for input(s): ETH in the last 72 hours. Urinalysis:  Recent Labs  09/07/15 1025  COLORURINE YELLOW  LABSPEC 1.039*  PHURINE 5.5  GLUCOSEU >1000*  HGBUR MODERATE*  BILIRUBINUR NEGATIVE  KETONESUR 40*  PROTEINUR 100*  NITRITE POSITIVE*  LEUKOCYTESUR SMALL*    Imaging results:  Dg Chest 2 View  09/07/2015  CLINICAL DATA:  Elevated blood sugar and cough, history of tobacco use EXAM: CHEST  2 VIEW COMPARISON:  07/08/2015 FINDINGS: The heart size and mediastinal contours are within normal limits. Both lungs are clear. The visualized skeletal structures are unremarkable.  IMPRESSION: No active cardiopulmonary disease. Electronically Signed  By: Inez Catalina M.D.   On: 09/07/2015 13:35   Ct Renal Stone Study  09/07/2015  CLINICAL DATA:  Bilateral flank pain, left flank pain for 4 days EXAM: CT ABDOMEN AND PELVIS WITHOUT CONTRAST TECHNIQUE: Multidetector CT imaging of the abdomen and pelvis was performed following the standard protocol without IV contrast. COMPARISON:  08/10/2015 FINDINGS: Lower chest:  The lung bases are unremarkable. Hepatobiliary: No intrahepatic biliary ductal dilatation. No calcified gallstones are noted within gallbladder. No CBD dilatation. Pancreas: Unenhanced pancreas is unremarkable. Spleen: Unenhanced spleen is unremarkable. Adrenals/Urinary Tract: Low-density nodule left adrenal gland probable adenoma measures 1 cm stable in size in appearance from prior exam. The right adrenal gland is unremarkable. Unenhanced kidneys are symmetrical in size. No hydronephrosis or hydroureter. There is subtle mild left perinephric and proximal left periureteral stranding. Left urinary tract inflammation or a recent passed left ureteral calculus cannot be excluded. No definite ureteral calculi are noted bilaterally. No calcified calculi are noted within urinary bladder. Small amount of air is noted within anterior aspect of urinary bladder. This may be due to recent instrumentation or infection. Clinical correlation is necessary. Again noted punctate nonobstructive renal calcifications bilateral without significant change from prior exam. The largest nonobstructive calculus in midpole of the right kidney measures 4 mm. The largest punctate calculus in upper pole of the left kidney measures 2 mm. Stomach/Bowel: No gastric outlet obstruction. There is subtle mild distension of distal jejunum with some fluid without thickening of small bowel wall. Mild enteritis or ileus cannot be excluded. No definite evidence of small bowel obstruction. Moderate stool noted in right colon  transverse colon and descending colon. Surgical clips within retroperitoneum and bilateral posterior pelvis and anterior pelvis again noted. Vascular/Lymphatic: No retroperitoneal or mesenteric adenopathy. No aortic aneurysm. Atherosclerotic calcifications are noted abdominal aorta and iliac arteries. Reproductive: The patient is status post hysterectomy Other: No ascites or free air.  No inguinal adenopathy. Musculoskeletal: No destructive bony lesions are noted. Sagittal images of the spine shows stable minimal compression deformity upper endplate of L1 vertebral body. IMPRESSION: 1. Again noted bilateral nonobstructive nephrolithiasis. No hydronephrosis or hydroureter. No definite calcified ureteral calculi. There is minimal left perinephric stranding and left proximal periureteral stranding. Subtle left urinary tract inflammation or a recent passed left ureteral calculus cannot be excluded. Clinical correlation is necessary. 2. There is no evidence of calcified calculi within urinary bladder. Small amount of air within anterior aspect of the bladder may be post recent instrumentation. Infection cannot be excluded. Clinical correlation is necessary. 3. Status post hysterectomy. 4. Stable minimal compression deformity upper endplate of L1 vertebral body. 5. Minimal distension of distal jejunum with some fluid without thickening of jejunal wall. Mild ileus or enteritis cannot be excluded. No definite evidence of small bowel obstruction free Electronically Signed   By: Lahoma Crocker M.D.   On: 09/07/2015 14:58    Other results: EKG: Sinus tachycardia rate 108, left-axis deviation, slow R wave progression, .  Assessment & Plan by Problem: Principal Problem:   Pyelonephritis Active Problems:   Anxiety   Diabetes mellitus type 2, uncontrolled ([place]), diagnosed Nov 2016   Depression   Sepsis [facility])  Ms. Bethany Mccarthy is a 50 year old female with PMH of T2DM, HLD, C5-C6 radiculopathy, cervical cancer s/p  hysterectomy and oopherectomy, renal stones, and pyelonephritis who presents with b/l CVA tenderness, leukocytosis, and lactic acidosis.   Complicated Pyelonephritis: Patient with b/l CVA tenderness and history of pyelonephritis and renal stones. She has an  elevated white count of 22,000. CT shows bilateral nonobstructive nephrolithiasis with perinephric stranding which likely indicates renal infection. Due to her uncontrolled diabetes, she would be considered to have a complicated pyelonephritis. She was started on IV Ceftriaxone in the ED which we will continue. Will need antibiotics for 7-14 days. Prior culture grew E coli sensitive to Ceftriaxone, Cipro, Bactrim, among others. Patient reports allergies to sulfa drugs and Levaquin. -IV Ceftriaxone 1 gram daily -Tylenol #3 q8h prn pain -Toradol 15 mg IV q6h prn pain -Morphine 1 mg IV q3h prn breakthrough pain only after above medications offered -Zofran 4 mg PO or IV q6h prn -f/u lactic acid -f/u urine and blood cultures -CBC in am   Uncontrolled Type 2 DM: Patient diagnosed in November 2016 on last hospital admission with Hgb A1C of 10.1. Takes Metformin 1000 mg BID and sliding scale insulin at home with reported blood sugars around 140. Serum glucose on presentation was 432 with an anion gap of 17. UA showed ketonuria and lactic acid was elevated at 3.57. Patient also reports recent polyuria and polydipsia. It is possible patient's infection has placed her into an uncomplicated DKA. Repeat BMET shows gap of 13. Will monitor CBGs, BMET, and give subcutaneous insulin in place of insulin drip, IVF NS, and monitor response. -Novolog 15 units once -NS @ 100 mL/hr -f/u BMP -Replete potassium as needed -Hgb A1C   HLD: -Pravastatin 10mg  qd  Depression/Anxiety: -Xanax 0.25 mg prn qhs -Fluoxetine 10 mg po qhs -Trazodone 100 mg po qhs  C5-C6 radiculopathy: -Gabapentin 300 mg po BID  Diet: NPO, clear sips for now  DVT ppx: Lovenox  Code:  FULL      Dispo: Disposition is deferred at this time, awaiting improvement of current medical problems. Anticipated discharge in approximately 1-3 day(s).   The patient does not have a current PCP (No Pcp Per Patient) and does need an [facility] hospital follow-up appointment after discharge.  The patient does not have transportation limitations that hinder transportation to clinic appointments.  Signed: Zada Finders, MD 09/07/2015, 5:17 PM

## 2015-09-07 NOTE — H&P (Signed)
Date: 09/07/2015               Patient Name:  Bethany Mccarthy MRN: [id number]  DOB: [birth year] Age / Sex: 50 y.o., female   PCP: No Pcp Per Patient              Medical Service: Internal Medicine Teaching Service              Attending Physician: Dr. Annia Belt, MD    First Contact: Andi Devon, [place] [facility] Pager: [phone]  Second Contact: Dr. Posey Pronto Pager: [phone]  Third Contact Dr. Randell Patient Pager: [phone]       After Hours (After 5p/  First Contact Pager: [phone]  weekends / holidays): Second Contact Pager: [phone]   Chief Complaint: back and abdominal pain  History of Present Illness: Bethany Mccarthy is a 50 year old with a past medical history of type II diabetes mellitus, pyelonephritis, cervical cancer s/p hysterectomy, and hyperlipidemia who presented to the ED with a four-day history of worsening abdominal pain radiating to the back. The pain is 10/10, burning, with "flutters" or spikes that are worse, and worse with urination and walking. Laying with her legs bent helps. She reports nausea and 7-8 episodes of emesis since yesterday and is unable to eat anything. She has had increased urinary frequency and increased thirst. Denies hematuria. She reports nephrolithiasis treated with lithotripsy about a year ago and says the pain is similar to that episode. She took [facility] powder for the pain and Pepto Bismol for the nausea; neither helped. She endorses feeling feverish, chills, generalized body aches, cough with white sputum, and palpitation. Patient denies diaphoresis, diarrhea or constipation, chest pain, or sick contacts.  In the ED, her CBC showed an elevated white count at 22,600. Her lactate was elevated at 3.57. She was tachycardic. Her blood pressure was stable and she was afebrile. She was given ceftriaxone and IV fluids per sepsis protocol. Her urinalysis showed pyuria with ketones, proteinuria, and moderate hemoglobinuria. Blood and urine cultures were sent. Her CXR showed no  evidence of pneumonia. She had a CT scan which showed no obstructing stones, but she has bilateral non-obstructive nephrolithiasis with left proximal periureteral stranding and possible subtle left urinary tract inflammation. Her EKG showed no acute changes.    PMH:  Nephrolithiasis - treated with lithotripsy (?) Pyelonephritis - 04/2015  Type II diabetes mellitus - diagnosed 04/2015, managed with metformin and sliding scale insulin Cervical cancer s/p hysterectomy 1993 C5-C6 cervical radiculopathy  Hyperlipidemia Anxiety Insomnia  Surgical History:  Hysterectomy 1993 Bilateral oophorectomy 2013  Family History: Her father's sister has a history of kidney stones. Her father's mother has a history of diabetes. Her father's sister died from lung cancer at age 35.  Social History: She has a history of smoking 1 pack per day with a 35 pack year history. She began to quit about a month ago and currently smokes 3 cigarettes/day. She does not drink or use recreational drugs. 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  Meds: Current Facility-Administered Medications  Medication Dose Route Frequency Provider Last Rate Last Dose  . [START ON 09/08/2015] cefTRIAXone (ROCEPHIN) 1 g in dextrose 5 % 50 mL IVPB  1 g Intravenous Q24H Rachel L Rumbarger, RPH      . ketorolac (TORADOL) 15 MG/ML injection 15 mg  15 mg Intravenous Q6H PRN Milagros Loll, MD       Current Outpatient Prescriptions  Medication Sig Dispense Refill  . ALPRAZolam (XANAX) 0.25 MG tablet  Take 1 tablet (0.25 mg total) by mouth at bedtime as needed (anxiety). 30 tablet 0  . blood glucose meter kit and supplies KIT Dispense based on patient and insurance preference. Use up to four times daily as directed. (FOR ICD-9 250.00, 250.01). 1 each 0  . Blood Glucose Monitoring Suppl (TRUE METRIX METER) DEVI 1 each by Does not apply route 3 (three) times daily before meals. 1 Device 0  . FLUoxetine (PROZAC) 10 MG capsule Take 1  capsule (10 mg total) by mouth daily. (Patient taking differently: Take 10 mg by mouth at bedtime. ) 30 capsule 2  . gabapentin (NEURONTIN) 300 MG capsule Take 1 capsule (300 mg total) by mouth 2 (two) times daily. 60 capsule 2  . glimepiride (AMARYL) 4 MG tablet Take 1 tablet (4 mg total) by mouth daily with breakfast. 30 tablet 2  . glucose blood (TRUE METRIX BLOOD GLUCOSE TEST) test strip Used 3 times daily before meals 100 each 12  . insulin regular (NOVOLIN R) 100 units/mL injection Correction scale prior to meals three times a day 121-150- 2 units,  151-200- 3 units,  201-250- 5 units,  251-300- 8 units,  301-350- 11 units, 351-400- 15 units, > 400- 15 units and call MD  Bedtime correction scale: 201-250-2 units,  251-300-3 units,  301-350-4 units, 351-400-5 units, >400- 5 units and call MD 10 mL 11  . insulin starter kit- syringes MISC 1 kit by Other route once. 1 kit 0  . Insulin Syringes, Disposable, U-100 0.5 ML MISC Type 2 diabetes mellitus 100 each 1  . lidocaine (LIDODERM) 5 % Place 1 patch onto the skin daily. Remove & Discard patch within 12 hours or as directed by MD (Patient taking differently: Place 1 patch onto the skin daily as needed (for lower back). Remove & Discard patch within 12 hours or as directed by MD) 30 patch 1  . metFORMIN (GLUCOPHAGE) 1000 MG tablet Take 1 tablet (1,000 mg total) by mouth 2 (two) times daily with a meal. 60 tablet 2  . pravastatin (PRAVACHOL) 10 MG tablet Take 1 tablet (10 mg total) by mouth at bedtime. 30 tablet 3  . traZODone (DESYREL) 100 MG tablet Take 1 tablet (100 mg total) by mouth at bedtime as needed for sleep. 30 tablet 2  . TRUEPLUS LANCETS 28G MISC 1 each by Does not apply route 3 (three) times daily before meals. 100 each 12  . acetaminophen-codeine (TYLENOL #3) 300-30 MG tablet Take 1 tablet by mouth every 8 (eight) hours as needed for moderate pain. 60 tablet 0    Allergies: Allergies as of 09/07/2015 - Review Complete 09/07/2015    Allergen Reaction Noted  . Levaquin [levofloxacin in d5w] Hives 04/25/2015  . Sulfa antibiotics Hives 04/25/2015   Past Medical History  Diagnosis Date  . Anxiety   . Insomnia   . Tobacco abuse   . Pyelonephritis     Archie Endo 04/25/2015  . Chronic bronchitis ([place])   . GERD (gastroesophageal reflux disease)   . Chronic lower back pain   . Uterine cancer ([place])     Review of Systems: Constitutional: positive for anorexia, chills and fevers, negative for sweats Respiratory: positive for cough and sputum, negative for dyspnea on exertion Cardiovascular: positive for palpitations, negative for chest pain Gastrointestinal: positive for abdominal pain, nausea and vomiting, negative for change in bowel habits, constipation and diarrhea Genitourinary:positive for dysuria and frequency, negative for hematuria Musculoskeletal:positive for myalgias  Physical Exam: Blood pressure 156/86, pulse 94, temperature  98.9 F (37.2 C), temperature source Oral, resp. rate 16, SpO2 98 %.  General: Well-nourished woman. Looks uncomfortable lying in bed with knees bent. Cardiovascular: Tachycardic. Regular rhythm. Normal S1, S2. No murmur, rub, or gallop. Dorsal pedal pulses 2+ bilaterally. Lungs: Clear to auscultation bilaterally.  Abdominal: Normoactive bowel sounds. Diffuse tenderness to light palpation. No masses or organomegaly appreciated. Exquisite CVA tenderness. MSK: No edema of the lower extremities. Skin: Dry. No rashes.  Lab Results: Basic Metabolic Panel:  Recent Labs Lab 09/07/15 1019 09/07/15 1450  NA 128* 132*  K 4.1 3.6  CL 91* 97*  CO2 20* 22  GLUCOSE 432* 322*  BUN 12 12  CREATININE 0.94 0.71  CALCIUM 10.4* 8.9   Liver Function Tests:  Recent Labs Lab 09/07/15 1019  AST 23  ALT 30  ALKPHOS 100  BILITOT 1.2  PROT 8.0  ALBUMIN 4.0    Recent Labs Lab 09/07/15 1019  LIPASE 51    CBC:  Recent Labs Lab 09/07/15 1019  WBC 22.6*  HGB 15.8*  HCT 43.3  MCV  89.8  PLT 255   Urinalysis:  Recent Labs Lab 09/07/15 1025  COLORURINE YELLOW  LABSPEC 1.039*  PHURINE 5.5  GLUCOSEU >1000*  HGBUR MODERATE*  BILIRUBINUR NEGATIVE  KETONESUR 40*  PROTEINUR 100*  NITRITE POSITIVE*  LEUKOCYTESUR SMALL*    Imaging results:  Dg Chest 2 View  09/07/2015  CLINICAL DATA:  Elevated blood sugar and cough, history of tobacco use EXAM: CHEST  2 VIEW COMPARISON:  07/08/2015 FINDINGS: The heart size and mediastinal contours are within normal limits. Both lungs are clear. The visualized skeletal structures are unremarkable. IMPRESSION: No active cardiopulmonary disease. Electronically Signed   By: Inez Catalina M.D.   On: 09/07/2015 13:35   Ct Renal Stone Study  09/07/2015  CLINICAL DATA:  Bilateral flank pain, left flank pain for 4 days EXAM: CT ABDOMEN AND PELVIS WITHOUT CONTRAST TECHNIQUE: Multidetector CT imaging of the abdomen and pelvis was performed following the standard protocol without IV contrast. COMPARISON:  08/10/2015 FINDINGS: Lower chest:  The lung bases are unremarkable. Hepatobiliary: No intrahepatic biliary ductal dilatation. No calcified gallstones are noted within gallbladder. No CBD dilatation. Pancreas: Unenhanced pancreas is unremarkable. Spleen: Unenhanced spleen is unremarkable. Adrenals/Urinary Tract: Low-density nodule left adrenal gland probable adenoma measures 1 cm stable in size in appearance from prior exam. The right adrenal gland is unremarkable. Unenhanced kidneys are symmetrical in size. No hydronephrosis or hydroureter. There is subtle mild left perinephric and proximal left periureteral stranding. Left urinary tract inflammation or a recent passed left ureteral calculus cannot be excluded. No definite ureteral calculi are noted bilaterally. No calcified calculi are noted within urinary bladder. Small amount of air is noted within anterior aspect of urinary bladder. This may be due to recent instrumentation or infection. Clinical  correlation is necessary. Again noted punctate nonobstructive renal calcifications bilateral without significant change from prior exam. The largest nonobstructive calculus in midpole of the right kidney measures 4 mm. The largest punctate calculus in upper pole of the left kidney measures 2 mm. Stomach/Bowel: No gastric outlet obstruction. There is subtle mild distension of distal jejunum with some fluid without thickening of small bowel wall. Mild enteritis or ileus cannot be excluded. No definite evidence of small bowel obstruction. Moderate stool noted in right colon transverse colon and descending colon. Surgical clips within retroperitoneum and bilateral posterior pelvis and anterior pelvis again noted. Vascular/Lymphatic: No retroperitoneal or mesenteric adenopathy. No aortic aneurysm. Atherosclerotic calcifications are noted abdominal  aorta and iliac arteries. Reproductive: The patient is status post hysterectomy Other: No ascites or free air.  No inguinal adenopathy. Musculoskeletal: No destructive bony lesions are noted. Sagittal images of the spine shows stable minimal compression deformity upper endplate of L1 vertebral body. IMPRESSION: 1. Again noted bilateral nonobstructive nephrolithiasis. No hydronephrosis or hydroureter. No definite calcified ureteral calculi. There is minimal left perinephric stranding and left proximal periureteral stranding. Subtle left urinary tract inflammation or a recent passed left ureteral calculus cannot be excluded. Clinical correlation is necessary. 2. There is no evidence of calcified calculi within urinary bladder. Small amount of air within anterior aspect of the bladder may be post recent instrumentation. Infection cannot be excluded. Clinical correlation is necessary. 3. Status post hysterectomy. 4. Stable minimal compression deformity upper endplate of L1 vertebral body. 5. Minimal distension of distal jejunum with some fluid without thickening of jejunal wall.  Mild ileus or enteritis cannot be excluded. No definite evidence of small bowel obstruction free Electronically Signed   By: Lahoma Crocker M.D.   On: 09/07/2015 14:58    Other results: EKG: Rate approximately 100. Sinus tachycardia. PR interval 160 ms. QRS interval 80 ms. QT interval 360 ms. QTc 465 ms. Left axis deviation.   Assessment & Plan by Problem: Mrs. Graw is a 50 year old with a past medical history of type II diabetes mellitus, pyelonephritis, cervical cancer s/p hysterectomy, and hyperlipidemia who presented to the ED with a four-day history of worsening abdominal pain radiating to the back. Differential diagnosis includes pyelonephritis, cystitis, nephrolithiasis, pancreatitis. She is s/p hysterectomy and bilateral oophorectomy. Cystitis presents with dysuria, frequency, and suprapubic pain. In this case, pyelonephritis is more likely than cystitis due to the presence of those symptoms plus CVA tenderness, chills, flank pain, and nausea and vomiting. CT showed bilateral nonobstructive nephrolithiasis, which would not cause pyuria without a superimposed infection. Her lipase was 51 upon arrival.  Pyelonephritis: The patient presented with a four day history of abdominal and flank pain, nausea and vomiting, and exquisite CVA tenderness. CBC showed leukocytosis at 22.6. Urinalysis was positive for many bacteria, moderate Hgb, nitrites, leukocytes, and protein. CT showed bilateral non-obstructive nephrolithiasis with left proximal periureteral stranding and possible subtle left urinary tract inflammation. There was no evidence of hydronephrosis, hydroureter, or ureteral calculi. Bladder infection could not be excluded. - Continue IV ceftriaxone 1 g IV q24 hr. Switch to PO once clinical symptoms begin to resolve. Continue antibiotics for 7 to 14 days depending on response. - Urine culture. - Blood culture. - Pain management with Toradol 15 mg IV q6 hr PRN pain and morphine 2 mg q3 hr PRN  breakthrough pain.  Type II diabetes mellitus complicated by diabetic ketoacidosis: The patient endorses increased thirst and urinary frequency over the past few days. She says she has been compliant with her metformin and insulin regimen. Upon admission, her blood glucose was 432, her anion gap was 17, and urine ketones were 40. In the ED, she received a 2 L bolus of NS and is currently getting 100 mL/hr NS. After receiving fluids, her glucose is 322 and anion gap is 13. Her potassium dropped from 4.1 to 3.6.  - Rapid-acting insulin 20 units SQ once, followed by 12 units SQ 1 hr later. Give 12 units SQ q2 hr until glucose <200. When glucose <200, give 6 units SQ q2 hr PRN glucose between 150-200. - Continue 100 mL/hr NS. - Replete K+ PRN. - Continue metformin 500 mg PO BID. - Repeat BMP at  18:00 and in morning.  Hyperlipidemia:  - Continue pravastatin 10 mg tablet PO at bedtime.  Anxiety: - Continue fluoxetine 10 mg PO at bedtime. - Alprazolam 0.25 mg tablet PO at bedtime PRN anxiety.  Insomnia:  - Continue trazodone 100 mg PO at bedtime PRN insomnia.  Diet: NPO except sips with medications DVT Ppx: Lovenox Code status: Full  This is a Careers information officer Note.  The care of the patient was discussed with Dr. Posey Pronto and the assessment and plan was formulated with their assistance.  Please see their note for official documentation of the patient encounter.   Signed: Kreg Shropshire, Med Student 09/07/2015, 3:23 PM

## 2015-09-07 NOTE — Progress Notes (Signed)
Admission note:   Arrival Method: From ED.  Mental Status: A&OX4. Telemetry: Placed on box #10.  Skin: Intact.  Tubes: N/A. IV: LH NSL, RFA NSL. Pain: 10/10 lower back and pelvis pain.  Family: No one at bedside. Living Situation: From home. Safety Measures: Viewed safety video. Instructed to call for assistance. 6E Orientation: Given patient information guide. Call bell within reach.  Joellen Jersey, RN.

## 2015-09-07 NOTE — ED Notes (Addendum)
Pt reports generalized abd pain that radiates around to right side of back. Has pain with urination, hx of kidney stones. Reports n/v. Denies diarrhea.

## 2015-09-08 LAB — BASIC METABOLIC PANEL
ANION GAP: 12 (ref 5–15)
Anion gap: 11 (ref 5–15)
Anion gap: 9 (ref 5–15)
BUN: 17 mg/dL (ref 6–20)
BUN: 13 mg/dL (ref 6–20)
BUN: 12 mg/dL (ref 6–20)
CHLORIDE: 97 mmol/L — AB (ref 101–111)
CHLORIDE: 98 mmol/L — AB (ref 101–111)
CHLORIDE: 100 mmol/L — AB (ref 101–111)
CO2: 24 mmol/L (ref 22–32)
CO2: 24 mmol/L (ref 22–32)
CO2: 24 mmol/L (ref 22–32)
CREATININE: 0.62 mg/dL (ref 0.44–1.00)
CREATININE: 0.62 mg/dL (ref 0.44–1.00)
Calcium: 8.8 mg/dL — ABNORMAL LOW (ref 8.9–10.3)
Calcium: 8.9 mg/dL (ref 8.9–10.3)
Calcium: 9 mg/dL (ref 8.9–10.3)
Creatinine, Ser: 0.7 mg/dL (ref 0.44–1.00)
GFR calc Af Amer: >60 mL/min (ref >60)
GFR calc Af Amer: >60 mL/min (ref >60)
GFR calc Af Amer: >60 mL/min (ref >60)
GFR calc non Af Amer: >60 mL/min (ref >60)
GFR calc non Af Amer: >60 mL/min (ref >60)
GLUCOSE: 200 mg/dL — AB (ref 65–99)
GLUCOSE: 179 mg/dL — AB (ref 65–99)
GLUCOSE: 175 mg/dL — AB (ref 65–99)
POTASSIUM: 3.6 mmol/L (ref 3.5–5.1)
POTASSIUM: 4.3 mmol/L (ref 3.5–5.1)
POTASSIUM: 4.2 mmol/L (ref 3.5–5.1)
Sodium: 133 mmol/L — ABNORMAL LOW (ref 135–145)
Sodium: 133 mmol/L — ABNORMAL LOW (ref 135–145)
Sodium: 133 mmol/L — ABNORMAL LOW (ref 135–145)

## 2015-09-08 LAB — GLUCOSE, CAPILLARY
GLUCOSE-CAPILLARY: 150 mg/dL — AB (ref 65–99)
GLUCOSE-CAPILLARY: 153 mg/dL — AB (ref 65–99)
GLUCOSE-CAPILLARY: 175 mg/dL — AB (ref 65–99)
GLUCOSE-CAPILLARY: 181 mg/dL — AB (ref 65–99)
GLUCOSE-CAPILLARY: 229 mg/dL — AB (ref 65–99)
Glucose-Capillary: 169 mg/dL — ABNORMAL HIGH (ref 65–99)
Glucose-Capillary: 154 mg/dL — ABNORMAL HIGH (ref 65–99)
Glucose-Capillary: 157 mg/dL — ABNORMAL HIGH (ref 65–99)
Glucose-Capillary: 210 mg/dL — ABNORMAL HIGH (ref 65–99)
Glucose-Capillary: 186 mg/dL — ABNORMAL HIGH (ref 65–99)

## 2015-09-08 LAB — HIV ANTIBODY (ROUTINE TESTING W REFLEX): HIV SCREEN 4TH GENERATION: NONREACTIVE

## 2015-09-08 LAB — CBC
HEMATOCRIT: 36.5 % (ref 36.0–46.0)
Hemoglobin: 12.8 g/dL (ref 12.0–15.0)
MCH: 31.6 pg (ref 26.0–34.0)
MCHC: 35.1 g/dL (ref 30.0–36.0)
MCV: 90.1 fL (ref 78.0–100.0)
PLATELETS: 202 10*3/uL (ref 150–400)
RBC: 4.05 MIL/uL (ref 3.87–5.11)
RDW: 12.1 % (ref 11.5–15.5)
WBC: 16.1 10*3/uL — AB (ref 4.0–10.5)

## 2015-09-08 LAB — HEMOGLOBIN A1C
HEMOGLOBIN A1C: 8.8 % — AB (ref 4.8–5.6)
MEAN PLASMA GLUCOSE: 206 mg/dL

## 2015-09-08 MED ORDER — INSULIN GLARGINE 100 UNIT/ML ~~LOC~~ SOLN
15.0000 [IU] | Freq: Every day | SUBCUTANEOUS | Status: DC
  Administered 2015-09-08 – 2015-09-09 (×2): 15 [IU] via SUBCUTANEOUS
  Filled 2015-09-08 (×2): qty 0.15

## 2015-09-08 MED ORDER — IBUPROFEN 400 MG PO TABS
800.0000 mg | ORAL_TABLET | Freq: Three times a day (TID) | ORAL | Status: DC
Start: 2015-09-08 — End: 2015-09-08

## 2015-09-08 MED ORDER — INSULIN REGULAR HUMAN 100 UNIT/ML IJ SOLN
INTRAMUSCULAR | Status: DC
  Filled 2015-09-08: qty 2.5

## 2015-09-08 MED ORDER — INSULIN ASPART 100 UNIT/ML ~~LOC~~ SOLN
0.0000 [IU] | Freq: Three times a day (TID) | SUBCUTANEOUS | Status: DC
  Administered 2015-09-08 (×2): 2 [IU] via SUBCUTANEOUS
  Administered 2015-09-09 (×2): 5 [IU] via SUBCUTANEOUS
  Administered 2015-09-09 – 2015-09-10 (×2): 2 [IU] via SUBCUTANEOUS
  Administered 2015-09-10: 3 [IU] via SUBCUTANEOUS
  Administered 2015-09-10: 2 [IU] via SUBCUTANEOUS
  Administered 2015-09-11: 5 [IU] via SUBCUTANEOUS
  Administered 2015-09-11 (×2): 2 [IU] via SUBCUTANEOUS
  Administered 2015-09-12 (×2): 1 [IU] via SUBCUTANEOUS

## 2015-09-08 MED ORDER — DEXTROSE 5 % IV SOLN
1.0000 g | Freq: Three times a day (TID) | INTRAVENOUS | Status: DC
  Administered 2015-09-08 – 2015-09-12 (×12): 1 g via INTRAVENOUS
  Filled 2015-09-08 (×17): qty 1

## 2015-09-08 MED ORDER — KETOROLAC TROMETHAMINE 15 MG/ML IJ SOLN: 15.0000 mg | Freq: Four times a day (QID) | INTRAMUSCULAR | Status: DC

## 2015-09-08 MED ORDER — ACETAMINOPHEN 500 MG PO TABS
1000.0000 mg | ORAL_TABLET | Freq: Three times a day (TID) | ORAL | Status: DC
  Administered 2015-09-08 (×2): 1000 mg via ORAL
  Administered 2015-09-08: 500 mg via ORAL
  Administered 2015-09-09 – 2015-09-11 (×7): 1000 mg via ORAL
  Filled 2015-09-08 (×11): qty 2

## 2015-09-08 MED ORDER — MORPHINE SULFATE (PF) 2 MG/ML IV SOLN
1.0000 mg | INTRAVENOUS | Status: AC | PRN
  Administered 2015-09-08 – 2015-09-09 (×4): 1 mg via INTRAVENOUS
  Filled 2015-09-08 (×4): qty 1

## 2015-09-08 MED ORDER — INSULIN ASPART 100 UNIT/ML ~~LOC~~ SOLN
0.0000 [IU] | Freq: Every day | SUBCUTANEOUS | Status: DC
  Administered 2015-09-08 – 2015-09-10 (×2): 2 [IU] via SUBCUTANEOUS

## 2015-09-08 MED ORDER — POTASSIUM CHLORIDE CRYS ER 20 MEQ PO TBCR
40.0000 meq | EXTENDED_RELEASE_TABLET | Freq: Two times a day (BID) | ORAL | Status: DC
  Administered 2015-09-08 – 2015-09-12 (×9): 40 meq via ORAL
  Filled 2015-09-08 (×10): qty 2

## 2015-09-08 NOTE — Progress Notes (Signed)
Subjective: No acute events overnight. Patient says her pain is still 10/10 and she does not feel any better than yesterday. She says she vomited and dry heaved last night, but not yet this morning. She requested her diet be advanced to liquids.  Objective: Vital signs in last 24 hours: Filed Vitals:   09/07/15 1554 09/07/15 1600 09/07/15 2135 09/08/15 0445  BP: 156/86  127/78 133/77  Pulse: 94  89 93  Temp: 98.9 F (37.2 C)  99 F (37.2 C) 98.5 F (36.9 C)  TempSrc: Oral  Oral Oral  Resp: 16  16 16   Height:  5\' 6"  (1.676 m)    Weight:  63.504 kg (140 lb)    SpO2: 98%  98% 98%    Intake/Output Summary (Last 24 hours) at 09/08/15 0931 Last data filed at 09/07/15 1800  Gross per 24 hour  Intake    370 ml  Output      0 ml  Net    370 ml   Physical Exam: General: Well-nourished. Resting in bed. Mild distress upon moving and sitting up. Cardiovascular: Regular rate and rhythm. Normal S1, S2. No murmur, rub, or gallop. Pulmonary: Normal respiratory effort. Abdominal: Soft, with generalized tenderness. Normal bowels sounds. Significant CVA tenderness still present.  Lab Results: Basic Metabolic Panel:  Recent Labs Lab 09/08/15 0231 09/08/15 0649  NA 133* 133*  K 4.3 4.2  CL 98* 100*  CO2 24 24  GLUCOSE 179* 175*  BUN 13 12  CREATININE 0.62 0.62  CALCIUM 8.9 9.0    CBC:  Recent Labs Lab 09/07/15 1019 09/08/15 0231  WBC 22.6* 16.1*  HGB 15.8* 12.8  HCT 43.3 36.5  MCV 89.8 90.1  PLT 255 202   CBG:  Recent Labs Lab 09/08/15 0043 09/08/15 0143 09/08/15 0244 09/08/15 0348 09/08/15 0448 09/08/15 0551  GLUCAP 175* 169* 154* 157* 153* 150*   Hemoglobin A1C:  Recent Labs Lab 09/07/15 1019  HGBA1C 8.8*   Urinalysis:  Recent Labs Lab 09/07/15 1025  COLORURINE YELLOW  LABSPEC 1.039*  PHURINE 5.5  GLUCOSEU >1000*  HGBUR MODERATE*  BILIRUBINUR NEGATIVE  KETONESUR 40*  PROTEINUR 100*  NITRITE POSITIVE*  LEUKOCYTESUR SMALL*    Studies/Results: Dg Chest 2 View  09/07/2015  CLINICAL DATA:  Elevated blood sugar and cough, history of tobacco use EXAM: CHEST  2 VIEW COMPARISON:  07/08/2015 FINDINGS: The heart size and mediastinal contours are within normal limits. Both lungs are clear. The visualized skeletal structures are unremarkable. IMPRESSION: No active cardiopulmonary disease. Electronically Signed   By: Inez Catalina M.D.   On: 09/07/2015 13:35   Ct Renal Stone Study  09/07/2015  CLINICAL DATA:  Bilateral flank pain, left flank pain for 4 days EXAM: CT ABDOMEN AND PELVIS WITHOUT CONTRAST TECHNIQUE: Multidetector CT imaging of the abdomen and pelvis was performed following the standard protocol without IV contrast. COMPARISON:  08/10/2015 FINDINGS: Lower chest:  The lung bases are unremarkable. Hepatobiliary: No intrahepatic biliary ductal dilatation. No calcified gallstones are noted within gallbladder. No CBD dilatation. Pancreas: Unenhanced pancreas is unremarkable. Spleen: Unenhanced spleen is unremarkable. Adrenals/Urinary Tract: Low-density nodule left adrenal gland probable adenoma measures 1 cm stable in size in appearance from prior exam. The right adrenal gland is unremarkable. Unenhanced kidneys are symmetrical in size. No hydronephrosis or hydroureter. There is subtle mild left perinephric and proximal left periureteral stranding. Left urinary tract inflammation or a recent passed left ureteral calculus cannot be excluded. No definite ureteral calculi are noted bilaterally. No calcified calculi  are noted within urinary bladder. Small amount of air is noted within anterior aspect of urinary bladder. This may be due to recent instrumentation or infection. Clinical correlation is necessary. Again noted punctate nonobstructive renal calcifications bilateral without significant change from prior exam. The largest nonobstructive calculus in midpole of the right kidney measures 4 mm. The largest punctate calculus in upper  pole of the left kidney measures 2 mm. Stomach/Bowel: No gastric outlet obstruction. There is subtle mild distension of distal jejunum with some fluid without thickening of small bowel wall. Mild enteritis or ileus cannot be excluded. No definite evidence of small bowel obstruction. Moderate stool noted in right colon transverse colon and descending colon. Surgical clips within retroperitoneum and bilateral posterior pelvis and anterior pelvis again noted. Vascular/Lymphatic: No retroperitoneal or mesenteric adenopathy. No aortic aneurysm. Atherosclerotic calcifications are noted abdominal aorta and iliac arteries. Reproductive: The patient is status post hysterectomy Other: No ascites or free air.  No inguinal adenopathy. Musculoskeletal: No destructive bony lesions are noted. Sagittal images of the spine shows stable minimal compression deformity upper endplate of L1 vertebral body. IMPRESSION: 1. Again noted bilateral nonobstructive nephrolithiasis. No hydronephrosis or hydroureter. No definite calcified ureteral calculi. There is minimal left perinephric stranding and left proximal periureteral stranding. Subtle left urinary tract inflammation or a recent passed left ureteral calculus cannot be excluded. Clinical correlation is necessary. 2. There is no evidence of calcified calculi within urinary bladder. Small amount of air within anterior aspect of the bladder may be post recent instrumentation. Infection cannot be excluded. Clinical correlation is necessary. 3. Status post hysterectomy. 4. Stable minimal compression deformity upper endplate of L1 vertebral body. 5. Minimal distension of distal jejunum with some fluid without thickening of jejunal wall. Mild ileus or enteritis cannot be excluded. No definite evidence of small bowel obstruction free Electronically Signed   By: Lahoma Crocker M.D.   On: 09/07/2015 14:58   Medications: I have reviewed the patient's current medications. Scheduled Meds: .  cefTRIAXone (ROCEPHIN)  IV  1 g Intravenous Q24H  . enoxaparin (LOVENOX) injection  40 mg Subcutaneous Q24H  . FLUoxetine  10 mg Oral QHS  . gabapentin  300 mg Oral BID  . ibuprofen  800 mg Oral TID WC  . insulin glargine  15 Units Subcutaneous Daily  . lidocaine  1 patch Transdermal Q24H  . potassium chloride  40 mEq Oral BID  . pravastatin  10 mg Oral QHS   Continuous Infusions: . sodium chloride    . dextrose 5 % and 0.45% NaCl 100 mL/hr at 09/07/15 2007   PRN Meds:.acetaminophen **OR** acetaminophen, acetaminophen-codeine, ALPRAZolam, morphine injection, ondansetron **OR** ondansetron (ZOFRAN) IV, traZODone   Assessment/Plan: Mrs. Bethany Mccarthy is a 50 year old with a past medical history of type II diabetes mellitus, pyelonephritis, cervical cancer s/p hysterectomy, and hyperlipidemia who presented to the ED with a four-day history of worsening abdominal pain radiating to the back, dysuria, increased urinary frequency, CVA tenderness, chills, flank pain, nausea, and vomiting.   Complicated pyelonephritis: The patient presented with a four day history of abdominal and flank pain, nausea and vomiting, and exquisite CVA tenderness. CBC showed leukocytosis at 22.6. Lactic acid at 3.5. Urinalysis was positive for many bacteria, moderate Hgb, nitrites, leukocytes, and protein. CT showed bilateral non-obstructive nephrolithiasis with left proximal periureteral stranding and possible subtle left urinary tract inflammation. Patient has not had any improvement in symptoms and is still nauseous. Lactic acid was 2.0 this morning. She requested clear liquids. - Day 2 of abx.  Continue IV ceftriaxone 1 g IV q24 hr. Switch to PO once clinical symptoms begin to resolve. Continue antibiotics for 7 to 14 days depending on response. - Urine culture pending. - Blood culture pending. - STOP toradol. - Pain management with ibuprofen 800 mg PO TID with meals and morphine 2 mg q3 hr PRN breakthrough pain. - Advance  diet as tolerated. - Will need to establish care with urology outpatient for management of non-obstructive nephrolithiasis.   Type II diabetes mellitus complicated by diabetic ketoacidosis: The patient endorses increased thirst and urinary frequency over the past few days. She says she has been compliant with her metformin and insulin regimen, but DKA is secondary to pyelonephritis. Upon admission, her blood glucose was 432, her anion gap was 17, and urine ketones were 40. Patient was placed on DKA protocol overnight. Her anion gap was closed x2 this morning, so she was transitioned back to SSI SQ. HgA1C was 8.8.  - Continue D5 / 1/2 NS 100 mL/hr until PO liquids are tolerated. - Replete K+ PRN. - Continue SSI. - Continue metformin 500 mg PO BID. - Monitor CBG QID before meals and at bedtime.  Hyperlipidemia:  - Continue pravastatin 10 mg tablet PO at bedtime.  Anxiety: - Continue fluoxetine 10 mg PO at bedtime. - Alprazolam 0.25 mg tablet PO at bedtime PRN anxiety.  Insomnia:  - Continue trazodone 100 mg PO at bedtime PRN insomnia.  Diet: Advance diet as tolerated. DVT Ppx: Lovenox Code status: Full  Dispo: Disposition is deferred at this time, awaiting improvement of current medical issues. Discharge anticipated in 1-2 day(s).   This is a Careers information officer Note.  The care of the patient was discussed with Dr. Posey Pronto and the assessment and plan formulated with their assistance.  Please see their attached note for official documentation of the daily encounter.   LOS: 1 day   Kreg Shropshire, Med Student 09/08/2015, 9:31 AM

## 2015-09-08 NOTE — Progress Notes (Signed)
   Subjective: Patient reports no improvement in her pain. She says the Toradol gives her headaches. She had continued nausea and vomiting last night, but no episodes this morning. She says her pain is located at the lower back with radiation to her flanks bilaterally. Her abdomen is sore from emesis.  Objective: Vital signs in last 24 hours: Filed Vitals:   09/07/15 1600 09/07/15 2135 09/08/15 0445 09/08/15 0900  BP:  127/78 133/77 108/61  Pulse:  89 93 80  Temp:  99 F (37.2 C) 98.5 F (36.9 C) 99.1 F (37.3 C)  TempSrc:  Oral Oral Oral  Resp:  16 16 16   Height: 5\' 6"  (1.676 m)     Weight: 140 lb (63.504 kg)     SpO2:  98% 98% 98%   Weight change:   Intake/Output Summary (Last 24 hours) at 09/08/15 1157 Last data filed at 09/08/15 0900  Gross per 24 hour  Intake    370 ml  Output      0 ml  Net    370 ml   General: resting in bed, mild distress Cardiac: RRR, no rubs, murmurs or gallops Pulm: clear to auscultation bilaterally, moving normal volumes of air Abd: soft, nontender, nondistended, hypoactive bowel sounds Back: B/l CVA tenderness with light palpation Ext: warm and well perfused, no pedal edema  Assessment/Plan: Principal Problem:   Pyelonephritis Active Problems:   Anxiety   Diabetes mellitus type 2, uncontrolled ([place]), diagnosed Nov 2016   Depression   Sepsis ([place])  Complicated Pyelonephritis: Patient reports continued pain without much improvement since admission. Her white count is 16.1, down from 22.6. Currently on IV Ceftriaxone. Will need antibiotics for 7-14 days depending on clinical response. Prior culture grew E coli sensitive to Ceftriaxone, Cipro, Bactrim, among others. -IV Ceftriaxone 1 gram daily -Tylenol #3 q8h prn pain -d/c toradol -Morphine 1 mg IV q3h prn breakthrough pain -Zofran 4 mg PO or IV q6h prn -f/u urine and blood cultures   Type 2 DM with DKA: Patient diagnosed in November 2016 on last hospital admission with Hgb A1C of 10.1.  A1C is 8.8 this admission. DKA likely kicked off by acute infection. Gap closed x2 overnight and she is transitioned of insulin drip. CBGs range from 153-228 overnight.  -Lantus 15 units daily -SSI-S -D5 1/2 NS @ 100 mL/hr -Advance diet as tolerated  HLD: -Pravastatin 10mg  qd  Depression/Anxiety: -Xanax 0.25 mg prn qhs -Fluoxetine 10 mg po qhs -Trazodone 100 mg po qhs  C5-C6 radiculopathy: -Gabapentin 300 mg po BID  Dispo: Disposition is deferred at this time, awaiting improvement of current medical problems.  Anticipated discharge in approximately 1-3 day(s).     LOS: 1 day   Zada Finders, MD 09/08/2015, 11:57 AM

## 2015-09-09 DIAGNOSIS — N200 Calculus of kidney: Secondary | ICD-10-CM

## 2015-09-09 DIAGNOSIS — B965 Pseudomonas (aeruginosa) (mallei) (pseudomallei) as the cause of diseases classified elsewhere: Secondary | ICD-10-CM

## 2015-09-09 LAB — CBC
HCT: 34.8 % — ABNORMAL LOW (ref 36.0–46.0)
HEMOGLOBIN: 12 g/dL (ref 12.0–15.0)
MCH: 31.3 pg (ref 26.0–34.0)
MCHC: 34.5 g/dL (ref 30.0–36.0)
MCV: 90.9 fL (ref 78.0–100.0)
Platelets: 206 10*3/uL (ref 150–400)
RBC: 3.83 MIL/uL — ABNORMAL LOW (ref 3.87–5.11)
RDW: 12 % (ref 11.5–15.5)
WBC: 8.8 10*3/uL (ref 4.0–10.5)

## 2015-09-09 LAB — URINE CULTURE: Culture: >=100000

## 2015-09-09 LAB — BASIC METABOLIC PANEL WITH GFR
Anion gap: 8 (ref 5–15)
BUN: 8 mg/dL (ref 6–20)
CO2: 26 mmol/L (ref 22–32)
Calcium: 9 mg/dL (ref 8.9–10.3)
Chloride: 101 mmol/L (ref 101–111)
Creatinine, Ser: 0.64 mg/dL (ref 0.44–1.00)
GFR calc Af Amer: >60 mL/min
GFR calc non Af Amer: >60 mL/min
Glucose, Bld: 282 mg/dL — ABNORMAL HIGH (ref 65–99)
Potassium: 3.8 mmol/L (ref 3.5–5.1)
Sodium: 135 mmol/L (ref 135–145)

## 2015-09-09 LAB — GLUCOSE, CAPILLARY
Glucose-Capillary: 230 mg/dL — ABNORMAL HIGH (ref 65–99)
Glucose-Capillary: 185 mg/dL — ABNORMAL HIGH (ref 65–99)
Glucose-Capillary: 214 mg/dL — ABNORMAL HIGH (ref 65–99)
Glucose-Capillary: 130 mg/dL — ABNORMAL HIGH (ref 65–99)

## 2015-09-09 MED ORDER — MORPHINE SULFATE (PF) 2 MG/ML IV SOLN
1.0000 mg | INTRAVENOUS | Status: DC | PRN
  Administered 2015-09-09 – 2015-09-11 (×12): 1 mg via INTRAVENOUS
  Filled 2015-09-09 (×11): qty 1

## 2015-09-09 MED ORDER — INSULIN GLARGINE 100 UNIT/ML ~~LOC~~ SOLN
20.0000 [IU] | Freq: Every day | SUBCUTANEOUS | Status: DC
  Administered 2015-09-10 – 2015-09-12 (×3): 20 [IU] via SUBCUTANEOUS
  Filled 2015-09-09 (×3): qty 0.2

## 2015-09-09 NOTE — Progress Notes (Signed)
   Subjective: Patient reports continued pain. She had nausea last night, but no emesis. She was able to eat jello.   Objective: Vital signs in last 24 hours: Filed Vitals:   09/08/15 0445 09/08/15 0900 09/08/15 1741 09/08/15 2202  BP: 133/77 108/61 98/59 118/57  Pulse: 93 80 64 63  Temp: 98.5 F (36.9 C) 99.1 F (37.3 C) 98.3 F (36.8 C) 98.3 F (36.8 C)  TempSrc: Oral Oral Oral Oral  Resp: 16 16 16 18   Height:      Weight:      SpO2: 98% 98% 100% 100%   Weight change:   Intake/Output Summary (Last 24 hours) at 09/09/15 1145 Last data filed at 09/09/15 [id number]  Gross per 24 hour  Intake 1741.67 ml  Output      0 ml  Net 1741.67 ml   General: resting in bed, no acute distress Cardiac: RRR, no rubs, murmurs or gallops Pulm: clear to auscultation bilaterally, moving normal volumes of air Abd: soft, nontender, nondistended Back: B/l CVA tenderness with light palpation Ext: warm and well perfused, no pedal edema  Assessment/Plan: Principal Problem:   Pyelonephritis Active Problems:   Anxiety   Diabetes mellitus type 2, uncontrolled ([place]), diagnosed Nov 2016   Depression   Sepsis ([place])  Complicated Pyelonephritis: Patient reports continued pain without much improvement since admission. Her white count is 8.8, down from 22.6 on admission. Urine culture growing pseudomonas pan-sensitive including Cefepime, Ceftazadime, and Cipro. IV Ceftriaxone switched to Ceftazidime yesterday.. Will need antibiotics for 7-14 days depending on clinical response. Patient reports allergy to Levofloxacin with hives which limits fluoroquinolone use when transitioning to oral meds.  -Continue IV Ceftazidime 1 gram TID (3/23 >> ) -Tylenol #3 q8h prn pain -IV Morphine 1 mg q3h prn breakthrough pain  -Zofran 4 mg PO or IV q6h prn -Add Cefuroxime sensitivity to culture   Type 2 DM with DKA: Patient diagnosed in November 2016 on last hospital admission with Hgb A1C of 10.1. A1C is 8.8 this  admission. DKA likely kicked off by acute infection. Gap closed x2 overnight and she is transitioned of insulin drip. CBGs range from 153-228 overnight.  -Increase Lantus to 20 units daily -SSI-S -Carb mod diet  HLD: -Pravastatin 10mg  qd  Depression/Anxiety: -Xanax 0.25 mg prn qhs -Fluoxetine 10 mg po qhs -Trazodone 100 mg po qhs  C5-C6 radiculopathy: -Gabapentin 300 mg po BID  Dispo: Disposition is deferred at this time, awaiting improvement of current medical problems.  Anticipated discharge in approximately 2-3 day(s).     LOS: 2 days   Zada Finders, MD 09/09/2015, 11:45 AM

## 2015-09-09 NOTE — Care Management Note (Signed)
Case Management Note  Patient Details  Name: Bethany Mccarthy MRN: [id number] Date of Birth: 10/15/1965  Subjective/Objective:              CM following for progression and d/c planning.      Action/Plan: 09/09/2015 Call placed to [facility] and [facility] where pt has been seen once before. Per that center, they are unable to schedule the pt at this time and request that the pt call back on Monday to schedule her appointment. Will place in d/c instructions for pt as she has transportation issues she can best schedule her own appointment to meet her transportation needs. Noted [place] bus routes, pt does not have Medicaid , therefor Medicaid transport is not an option. Transportation must be arranged by the pt or her family.    Expected Discharge Date:                  Expected Discharge Plan:  Home/Self Care  In-House Referral:  NA  Discharge planning Services  CM Consult, [facility]  Post Acute Care Choice:    Choice offered to:  NA  DME Arranged:  N/A DME Agency:  NA  HH Arranged:  NA HH Agency:  NA  Status of Service:  Completed, signed off  Medicare Important Message Given:    Date Medicare IM Given:    Medicare IM give by:    Date Additional Medicare IM Given:    Additional Medicare Important Message give by:     If discussed at [place] of Stay Meetings, dates discussed:    Additional Comments:  Adron Bene, RN 09/09/2015, 2:12 PM

## 2015-09-09 NOTE — Progress Notes (Signed)
Inpatient Diabetes Program Recommendations  AACE/ADA: New Consensus Statement on Inpatient Glycemic Control (2015)  Target Ranges:  Prepandial:   less than 140 mg/dL      Peak postprandial:   less than 180 mg/dL (1-2 hours)      Critically ill patients:  140 - 180 mg/dL   Results for Bethany Mccarthy, Bethany Mccarthy (MRN [id number]) as of 09/09/2015 10:41  Ref. Range 09/08/2015 08:07 09/08/2015 12:02 09/08/2015 17:38 09/08/2015 22:05 09/09/2015 08:16  Glucose-Capillary Latest Ref Range: 65-99 mg/dL 210 (H) 186 (H) 181 (H) 229 (H) 230 (H)   Review of Glycemic Control  Diabetes history: DM 2 Outpatient Diabetes medications: Amaryl 4 mg Daily, Novolin R  2-15 units TID, Metformin 1,000 BID Current orders for Inpatient glycemic control: Lantus 15 units, Novolog Sensitive + HS scale  Inpatient Diabetes Program Recommendations:   Glucose in the 180-230 range. Please consider increasing basal insulin to Lantus 20 units daily, Give additional 5 units this am.  Thanks,  Tama Headings RN, MSN, [facility] Inpatient Diabetes Coordinator Team Pager [phone] (8a-5p)

## 2015-09-09 NOTE — Progress Notes (Signed)
Subjective: No acute events overnight. She requested morphine 1 mg IV last night at 3 am for breakthrough pain.  Patient says she feels about the same as yesterday. She still feels nauseous, but has not had any episodes of emesis since yesterday. Her diet was advanced and she was able to eat some jello and clear liquids. Patient "woke up in a puddle of sweat" yesterday after a nap. Otherwise, she denies diaphoresis, feeling feverish, or having chills.  Objective: Vital signs in last 24 hours: Filed Vitals:   09/08/15 0445 09/08/15 0900 09/08/15 1741 09/08/15 2202  BP: 133/77 108/61 98/59 118/57  Pulse: 93 80 64 63  Temp: 98.5 F (36.9 C) 99.1 F (37.3 C) 98.3 F (36.8 C) 98.3 F (36.8 C)  TempSrc: Oral Oral Oral Oral  Resp: 16 16 16 18   Height:      Weight:      SpO2: 98% 98% 100% 100%    Intake/Output Summary (Last 24 hours) at 09/09/15 1012 Last data filed at 09/09/15 [id number]  Gross per 24 hour  Intake 1741.67 ml  Output      0 ml  Net 1741.67 ml   Physical exam: General: Well-nourished. Resting in bed. Discomfort apparent upon moving and sitting up. Cardiovascular: Regular rate and rhythm. Normal S1, S2. No murmur, rub, or gallop. Pulmonary: Normal respiratory effort. Lungs clear to auscultation bilaterally. Abdominal: Soft, with generalized tenderness. Hypoactive bowel sounds. CVA tenderness present bilaterally.  Lab Results: Basic Metabolic Panel:  Recent Labs Lab 09/08/15 0231 09/08/15 0649  NA 133* 133*  K 4.3 4.2  CL 98* 100*  CO2 24 24  GLUCOSE 179* 175*  BUN 13 12  CREATININE 0.62 0.62  CALCIUM 8.9 9.0    CBC:  Recent Labs Lab 09/07/15 1019 09/08/15 0231  WBC 22.6* 16.1*  HGB 15.8* 12.8  HCT 43.3 36.5  MCV 89.8 90.1  PLT 255 202   CBG:  Recent Labs Lab 09/08/15 0551 09/08/15 0807 09/08/15 1202 09/08/15 1738 09/08/15 2205 09/09/15 0816  GLUCAP 150* 210* 186* 181* 229* 230*   Hemoglobin A1C:  Recent Labs Lab 09/07/15 1019  HGBA1C  8.8*   Urinalysis:  Recent Labs Lab 09/07/15 1025  COLORURINE YELLOW  LABSPEC 1.039*  PHURINE 5.5  GLUCOSEU >1000*  HGBUR MODERATE*  BILIRUBINUR NEGATIVE  KETONESUR 40*  PROTEINUR 100*  NITRITE POSITIVE*  LEUKOCYTESUR SMALL*    Micro Results: Recent Results (from the past 240 hour(s))  Urine culture     Status: None (Preliminary result)   Collection Time: 09/07/15 10:25 AM  Result Value Ref Range Status   Specimen Description URINE, CLEAN CATCH  Final   Special Requests NONE  Final   Culture >=100,000 COLONIES/mL PSEUDOMONAS AERUGINOSA  Final   Report Status PENDING  Incomplete  Blood Culture (routine x 2)     Status: None (Preliminary result)   Collection Time: 09/07/15  2:10 PM  Result Value Ref Range Status   Specimen Description BLOOD RIGHT WRIST  Final   Special Requests BOTTLES DRAWN AEROBIC AND ANAEROBIC 5CC  Final   Culture NO GROWTH < 24 HOURS  Final   Report Status PENDING  Incomplete  Blood Culture (routine x 2)     Status: None (Preliminary result)   Collection Time: 09/07/15  2:49 PM  Result Value Ref Range Status   Specimen Description BLOOD LEFT HAND  Final   Special Requests BOTTLES DRAWN AEROBIC AND ANAEROBIC 5CC  Final   Culture NO GROWTH < 24 HOURS  Final  Report Status PENDING  Incomplete    Medications: I have reviewed the patient's current medications. Scheduled Meds: . acetaminophen  1,000 mg Oral TID  . cefTAZidime (FORTAZ)  IV  1 g Intravenous 3 times per day  . enoxaparin (LOVENOX) injection  40 mg Subcutaneous Q24H  . FLUoxetine  10 mg Oral QHS  . gabapentin  300 mg Oral BID  . insulin aspart  0-5 Units Subcutaneous QHS  . insulin aspart  0-9 Units Subcutaneous TID WC  . insulin glargine  15 Units Subcutaneous Daily  . lidocaine  1 patch Transdermal Q24H  . potassium chloride  40 mEq Oral BID  . pravastatin  10 mg Oral QHS   Continuous Infusions: . sodium chloride    . dextrose 5 % and 0.45% NaCl 100 mL/hr at 09/09/15 0042    PRN Meds:.acetaminophen-codeine, ALPRAZolam, morphine injection, ondansetron **OR** ondansetron (ZOFRAN) IV   Assessment/Plan: Principal Problem:   Pyelonephritis Active Problems:   Anxiety   Diabetes mellitus type 2, uncontrolled ([place]), diagnosed Nov 2016   Depression   Sepsis [facility])  Mrs. Nugen is a 50 year old with a past medical history of type II diabetes mellitus, pyelonephritis, cervical cancer s/p hysterectomy, and hyperlipidemia who presented to the ED with a four-day history of worsening abdominal pain radiating to the back, dysuria, increased urinary frequency, CVA tenderness, chills, flank pain, nausea, and vomiting.   Complicated pyelonephritis: The patient presented with a four day history of abdominal and flank pain, nausea and vomiting, and exquisite CVA tenderness. Upon admission, patient had leukocytosis and elevated lactic acid. Urinalysis was positive for many bacteria, moderate Hgb, nitrites, leukocytes, and protein. CT showed bilateral non-obstructive nephrolithiasis with left proximal periureteral stranding and possible subtle left urinary tract inflammation. Patient has not had any improvement in symptoms and is still nauseous. Urine culture showed Pseudomonas aeruginosa. Culture shows sensitivity to cefepime, ceftazidime, ciprofloxacin, gentamicin, imipenem, Zosyn. Sensitivity to cefuroxime pending for PO coverage upon discharge once symptoms improve. - Discontinue IV ceftriaxone. - Start ceftazidime 1g IV TID. - Blood culture pending. - Continue pain management with ibuprofen 800 mg PO TID with meals and morphine 2 mg q3 hr PRN breakthrough pain. - Advance diet as tolerated. - Will need to establish care with urology outpatient for management of non-obstructive nephrolithiasis.   Type II diabetes mellitus complicated by diabetic ketoacidosis: The patient endorses increased thirst and urinary frequency over the past few days. She says she has been compliant with her  metformin and insulin regimen, but DKA is secondary to pyelonephritis. Upon admission, her blood glucose was 432, her anion gap was 17, and urine ketones were 40. Patient was placed on DKA protocol. Her anion gap was closed x2 yesterday, so she was transitioned back to SSI SQ. HgA1C was 8.8. CBG has been between 170-200 yesterday and today. - Continue D5 / 1/2 NS 100 mL/hr until PO liquids are tolerated. - Replete K+ PRN. - Continue SSI. - Continue metformin 500 mg PO BID. - Monitor CBG QID before meals and at bedtime.  Nausea: Patient has continued nausea with no further emesis and is able to keep clears down. - Advance diet as tolerated. - Zofran 4 mg tablet PO q6 hr PRN nausea.  Hyperlipidemia:  - Continue pravastatin 10 mg tablet PO at bedtime.  Anxiety: - Continue fluoxetine 10 mg PO at bedtime. - Alprazolam 0.25 mg tablet PO at bedtime PRN anxiety.  Insomnia:  - Continue trazodone 100 mg PO at bedtime PRN insomnia.  C5-C6 radiculopathy: - Gabapentin  300 mg capsule PO BID.  Diet: Advance diet as tolerated. DVT Ppx: Lovenox Code status: Full  Dispo: Disposition is deferred at this time, awaiting improvement of current medical issues. Discharge anticipated in 1-2 day(s).    This is a Careers information officer Note.  The care of the patient was discussed with Dr. Posey Pronto and the assessment and plan formulated with their assistance.  Please see their attached note for official documentation of the daily encounter.   LOS: 2 days   Kreg Shropshire, Med Student 09/09/2015, 10:12 AM

## 2015-09-10 LAB — GLUCOSE, CAPILLARY
GLUCOSE-CAPILLARY: 182 mg/dL — AB (ref 65–99)
GLUCOSE-CAPILLARY: 213 mg/dL — AB (ref 65–99)
Glucose-Capillary: 175 mg/dL — ABNORMAL HIGH (ref 65–99)
Glucose-Capillary: 209 mg/dL — ABNORMAL HIGH (ref 65–99)

## 2015-09-10 MED ORDER — SODIUM CHLORIDE 0.9% FLUSH: 10.0000 mL | Freq: Two times a day (BID) | INTRAVENOUS | Status: DC

## 2015-09-10 MED ORDER — SODIUM CHLORIDE 0.9 % IV SOLN
INTRAVENOUS | Status: DC
  Administered 2015-09-10: 17:00:00 via INTRAVENOUS

## 2015-09-10 MED ORDER — SODIUM CHLORIDE 0.9% FLUSH
10.0000 mL | INTRAVENOUS | Status: DC | PRN
  Administered 2015-09-12: 10 mL
  Filled 2015-09-10: qty 40

## 2015-09-10 NOTE — Care Management Note (Signed)
Case Management Note  Patient Details  Name: Shardae Tillery MRN: [id number] Date of Birth: 07-Jan-1966  Subjective/Objective:                  Pseudomonas pyelonephritis Action/Plan: Discharge planning Expected Discharge Date:                  Expected Discharge Plan:  [place]  In-House Referral:  NA  Discharge planning Services  CM Consult, [facility]  Post Acute Care Choice:    Choice offered to:  NA, Patient  DME Arranged:  IV pump/equipment DME Agency:  [place]:  RN [facility] Agency:  [place]  Status of Service:  In process, will continue to follow  Medicare Important Message Given:    Date Medicare IM Given:    Medicare IM give by:    Date Additional Medicare IM Given:    Additional Medicare Important Message give by:     If discussed at [place] of Stay Meetings, dates discussed:    Additional Comments: Cm notes consult for home IV ABX.  Pt has no insurance.  CM called [employer] rep, Tiffany who states pt cannot be approved for charity home IV ABX and [facility] care until Monday.  PICC to be placed.  CM will follow for progression. Dellie Catholic, RN 09/10/2015, 4:31 PM

## 2015-09-10 NOTE — Progress Notes (Signed)
Peripherally Inserted Central Catheter/Midline Placement  The IV Nurse has discussed with the patient and/or persons authorized to consent for the patient, the purpose of this procedure and the potential benefits and risks involved with this procedure.  The benefits include less needle sticks, lab draws from the catheter and patient may be discharged home with the catheter.  Risks include, but not limited to, infection, bleeding, blood clot (thrombus formation), and puncture of an artery; nerve damage and irregular heat beat.  Alternatives to this procedure were also discussed.  PICC/Midline Placement Documentation  PICC Single Lumen [id number] PICC Right Basilic 36 cm 0 cm (Active)  Indication for Insertion or Continuance of Line Home intravenous therapies (PICC only) 09/10/2015  4:47 PM  Exposed Catheter (cm) 0 cm 09/10/2015  4:47 PM  Site Assessment Clean;Dry;Intact 09/10/2015  4:47 PM  Line Status Flushed;Saline locked;Blood return noted 09/10/2015  4:47 PM  Dressing Type Transparent 09/10/2015  4:47 PM  Dressing Status Clean;Dry;Intact 09/10/2015  4:47 PM  Dressing Change Due 09/17/15 09/10/2015  4:47 PM       [name] 09/10/2015, 4:49 PM

## 2015-09-10 NOTE — Progress Notes (Signed)
Subjective: No acute events overnight. She reports some improvement. She still has pelvic pain and back pain. Her nausea has improved. Tolerating PO intake.  Objective: Vital signs in last 24 hours: Filed Vitals:   09/09/15 1700 09/09/15 2024 09/10/15 0507 09/10/15 0840  BP: 108/62 98/45 120/56 94/50  Pulse: 64 66 59 61  Temp: 98.4 F (36.9 C) 98 F (36.7 C) 97.8 F (36.6 C) 98.2 F (36.8 C)  TempSrc: Oral Oral Oral Oral  Resp: 16 16 16 18   Height:      Weight:      SpO2: 98% 97% 100% 100%   Weight change:   Intake/Output Summary (Last 24 hours) at 09/10/15 1129 Last data filed at 09/10/15 0900  Gross per 24 hour  Intake 1548.33 ml  Output      0 ml  Net 1548.33 ml   General Apperance: NAD HEENT: Normocephalic, atraumatic, anicteric sclera Neck: Supple, trachea midline Lungs: Clear to auscultation bilaterally. No wheezes, rhonchi or rales. Breathing comfortably Heart: Regular rate and rhythm, no murmur/rub/gallop Abdomen: Soft, mildly tender to palpation in lower quadrants, nondistended, no rebound/guarding Extremities: Warm and well perfused, no edema Skin: No rashes or lesions Neurologic: Alert and interactive. No gross deficits.  Lab Results: Basic Metabolic Panel:  Recent Labs Lab 09/08/15 0649 09/09/15 0931  NA 133* 135  K 4.2 3.8  CL 100* 101  CO2 24 26  GLUCOSE 175* 282*  BUN 12 8  CREATININE 0.62 0.64  CALCIUM 9.0 9.0   Liver Function Tests:  Recent Labs Lab 09/07/15 1019  AST 23  ALT 30  ALKPHOS 100  BILITOT 1.2  PROT 8.0  ALBUMIN 4.0    Recent Labs Lab 09/07/15 1019  LIPASE 51   CBC:  Recent Labs Lab 09/08/15 0231 09/09/15 0931  WBC 16.1* 8.8  HGB 12.8 12.0  HCT 36.5 34.8*  MCV 90.1 90.9  PLT 202 206   CBG:  Recent Labs Lab 09/08/15 2205 09/09/15 0816 09/09/15 1221 09/09/15 1735 09/09/15 2144 09/10/15 0734  GLUCAP 229* 230* 185* 214* 130* 182*   Hemoglobin A1C:  Recent Labs Lab 09/07/15 1019  HGBA1C  8.8*   Urinalysis:  Recent Labs Lab 09/07/15 1025  COLORURINE YELLOW  LABSPEC 1.039*  PHURINE 5.5  GLUCOSEU >1000*  HGBUR MODERATE*  BILIRUBINUR NEGATIVE  KETONESUR 40*  PROTEINUR 100*  NITRITE POSITIVE*  LEUKOCYTESUR SMALL*    Micro Results: Recent Results (from the past 240 hour(s))  Urine culture     Status: None   Collection Time: 09/07/15 10:25 AM  Result Value Ref Range Status   Specimen Description URINE, CLEAN CATCH  Final   Special Requests NONE  Final   Culture >=100,000 COLONIES/mL PSEUDOMONAS AERUGINOSA  Final   Report Status 09/09/2015 FINAL  Final   Organism ID, Bacteria PSEUDOMONAS AERUGINOSA  Final      Susceptibility   Pseudomonas aeruginosa - MIC*    CEFTAZIDIME 4 SENSITIVE Sensitive     CIPROFLOXACIN <=0.25 SENSITIVE Sensitive     GENTAMICIN <=1 SENSITIVE Sensitive     IMIPENEM 1 SENSITIVE Sensitive     PIP/TAZO 8 SENSITIVE Sensitive     CEFEPIME 2 SENSITIVE Sensitive     * >=100,000 COLONIES/mL PSEUDOMONAS AERUGINOSA  Blood Culture (routine x 2)     Status: None (Preliminary result)   Collection Time: 09/07/15  2:10 PM  Result Value Ref Range Status   Specimen Description BLOOD RIGHT WRIST  Final   Special Requests BOTTLES DRAWN AEROBIC AND ANAEROBIC 5CC  Final   Culture NO GROWTH 3 DAYS  Final   Report Status PENDING  Incomplete  Blood Culture (routine x 2)     Status: None (Preliminary result)   Collection Time: 09/07/15  2:49 PM  Result Value Ref Range Status   Specimen Description BLOOD LEFT HAND  Final   Special Requests BOTTLES DRAWN AEROBIC AND ANAEROBIC 5CC  Final   Culture NO GROWTH 3 DAYS  Final   Report Status PENDING  Incomplete   Medications: I have reviewed the patient's current medications. Scheduled Meds: . acetaminophen  1,000 mg Oral TID  . cefTAZidime (FORTAZ)  IV  1 g Intravenous 3 times per day  . enoxaparin (LOVENOX) injection  40 mg Subcutaneous Q24H  . FLUoxetine  10 mg Oral QHS  . gabapentin  300 mg Oral BID  .  insulin aspart  0-5 Units Subcutaneous QHS  . insulin aspart  0-9 Units Subcutaneous TID WC  . insulin glargine  20 Units Subcutaneous Daily  . lidocaine  1 patch Transdermal Q24H  . potassium chloride  40 mEq Oral BID  . pravastatin  10 mg Oral QHS   Continuous Infusions:  PRN Meds:.acetaminophen-codeine, ALPRAZolam, morphine injection, ondansetron **OR** ondansetron (ZOFRAN) IV Assessment/Plan: 50 year old woman hospitalized 04/2015 for pyelonephritis, hx of DM presenting with abdominal pain, dysuria.  Complicated Pyelonephritis: Urine culture growing pseudomonas pan-sensitive including Cefepime, Ceftazadime, and Cipro. IV Ceftriaxone switched to Ceftazidime 3/23. Will need antibiotics for 14 days. Patient reports allergy to Levofloxacin with hives including around her oral mucosa which limits fluoroquinolone use when transitioning to oral meds.  -Continue IV Ceftazidime 1 gram TID (3/23 >> ) -Tylenol #3 q8h prn pain -IV Morphine 1 mg q3h prn breakthrough pain  -Zofran 4 mg PO or IV q6h prn -PICC placement -She will need referral to urology for her urolithiasis.  Type 2 DM with DKA: Patient diagnosed in November 2016 on last hospital admission with Hgb A1C of 10.1. A1C is 8.8 this admission. DKA resolved. -Continue Lantus 20 units daily -SSI-S -Carb mod diet  HLD: -Pravastatin 10mg  qd  Depression/Anxiety: -Xanax 0.25 mg prn qhs -Fluoxetine 10 mg po qhs -Trazodone 100 mg po qhs  C5-C6 radiculopathy: -Gabapentin 300 mg po BID  Dispo: Disposition is deferred at this time, awaiting improvement of current medical problems.  Anticipated discharge in approximately 1-2 day(s).     LOS: 3 days   Milagros Loll, MD 09/10/2015, 11:29 AM

## 2015-09-11 LAB — GLUCOSE, CAPILLARY
GLUCOSE-CAPILLARY: 169 mg/dL — AB (ref 65–99)
GLUCOSE-CAPILLARY: 170 mg/dL — AB (ref 65–99)
GLUCOSE-CAPILLARY: 147 mg/dL — AB (ref 65–99)
Glucose-Capillary: 252 mg/dL — ABNORMAL HIGH (ref 65–99)

## 2015-09-11 MED ORDER — IBUPROFEN 600 MG PO TABS
600.0000 mg | ORAL_TABLET | Freq: Three times a day (TID) | ORAL | Status: DC
  Administered 2015-09-11 – 2015-09-12 (×5): 600 mg via ORAL
  Filled 2015-09-11 (×6): qty 1

## 2015-09-11 MED ORDER — HYDROCODONE-ACETAMINOPHEN 5-325 MG PO TABS: 1.0000 | ORAL_TABLET | Freq: Four times a day (QID) | ORAL | Status: DC | PRN

## 2015-09-11 MED ORDER — ACETAMINOPHEN-CODEINE #3 300-30 MG PO TABS
1.0000 | ORAL_TABLET | Freq: Three times a day (TID) | ORAL | Status: DC | PRN
  Administered 2015-09-11: 1 via ORAL
  Filled 2015-09-11: qty 1

## 2015-09-11 MED ORDER — GLIMEPIRIDE 4 MG PO TABS
4.0000 mg | ORAL_TABLET | Freq: Every day | ORAL | Status: DC
  Administered 2015-09-12: 4 mg via ORAL
  Filled 2015-09-11 (×2): qty 1

## 2015-09-11 MED ORDER — IBUPROFEN 600 MG PO TABS
600.0000 mg | ORAL_TABLET | Freq: Four times a day (QID) | ORAL | Status: DC | PRN
Start: 2015-09-11 — End: 2015-09-11
  Administered 2015-09-11: 600 mg via ORAL
  Filled 2015-09-11: qty 1

## 2015-09-11 MED ORDER — METFORMIN HCL 500 MG PO TABS
1000.0000 mg | ORAL_TABLET | Freq: Two times a day (BID) | ORAL | Status: DC
  Administered 2015-09-11 – 2015-09-12 (×3): 1000 mg via ORAL
  Filled 2015-09-11 (×3): qty 2

## 2015-09-11 MED ORDER — HYDROCODONE-ACETAMINOPHEN 5-325 MG PO TABS
1.0000 | ORAL_TABLET | Freq: Three times a day (TID) | ORAL | Status: DC | PRN
  Administered 2015-09-11 – 2015-09-12 (×3): 1 via ORAL
  Filled 2015-09-11 (×3): qty 1

## 2015-09-11 NOTE — Care Management (Signed)
CM met with patient at bedside to discuss update on plans for transitional care. Patient updated, awaiting determination for [place] services with [facility], Scheduled follow up appt with [facility] walk-in clinic on 09/16/15 at [place]. Teach back done, patient verbalized understanding..  CM will follow up on Monday 3/38 with [facility] on decision.  CM will follow up on Monday 3/38 with [facility] on decision.

## 2015-09-11 NOTE — Progress Notes (Signed)
   Subjective: No acute events overnight. She reports some improvement. She still has pelvic pain and back pain. Her nausea has improved. Tolerating PO intake.  Objective: Vital signs in last 24 hours: Filed Vitals:   09/10/15 2106 09/10/15 2322 09/11/15 0521 09/11/15 0847  BP: 107/72 121/77 100/52 115/66  Pulse: 61 73 60 62  Temp: 98.8 F (37.1 C)  97.9 F (36.6 C) 98.5 F (36.9 C)  TempSrc: Oral  Oral Oral  Resp: 16  16 16   Height:      Weight: 143 lb 1.6 oz (64.91 kg)     SpO2: 100%  100% 100%   Weight change:   Intake/Output Summary (Last 24 hours) at 09/11/15 [id number] Last data filed at 09/11/15 0600  Gross per 24 hour  Intake 1170.33 ml  Output      2 ml  Net 1168.33 ml   General: sitting up in bed, no acute distress Cardiac: RRR, no rubs, murmurs or gallops Pulm: clear to auscultation bilaterally, moving normal volumes of air Abd: soft, nontender, nondistended, BS present Ext: PICC RUE, warm and well perfused, no pedal edema Neuro: alert and oriented X3   Assessment/Plan: 50 year old woman hospitalized 04/2015 for pyelonephritis, hx of DM presenting with abdominal pain, dysuria.  Complicated Pyelonephritis: Urine culture growing pseudomonas pan-sensitive including Cefepime, Ceftazadime, and Cipro. IV Ceftriaxone switched to Ceftazidime 3/23. Will need antibiotics for 14 days. Patient reports allergy to Levofloxacin with hives including around her oral mucosa which limits fluoroquinolone use when transitioning to oral meds. PICC placed for home antibiotic administration as IV abx are only reasonable option. Case management consulted for home health arrangements, appreciate assistance. -Continue IV Ceftazidime 1 gram TID (3/23 >> 4/5) -Tylenol #3 q8h prn pain -Ibuprofen 600 mg q6h prn -Zofran 4 mg PO or IV q6h prn -She will need outpatient referral to urology for her urolithiasis.  Type 2 DM with DKA: Patient diagnosed in November 2016 on last hospital admission with  Hgb A1C of 10.1. A1C is 8.8 this admission. DKA resolved. -Continue Lantus 20 units daily -SSI-S -Carb mod diet  HLD: -Pravastatin 10mg  qd  Depression/Anxiety: -Xanax 0.25 mg prn qhs -Fluoxetine 10 mg po qhs -Trazodone 100 mg po qhs  C5-C6 radiculopathy: -Gabapentin 300 mg po BID  Dispo: Disposition is deferred at this time, awaiting improvement of current medical problems.  Anticipated discharge in approximately 1-2 day(s).     LOS: 4 days   Zada Finders, MD 09/11/2015, 9:39 AM

## 2015-09-12 ENCOUNTER — Telehealth: Payer: Self-pay

## 2015-09-12 ENCOUNTER — Encounter (HOSPITAL_COMMUNITY): Payer: Self-pay | Admitting: Student

## 2015-09-12 DIAGNOSIS — B9689 Other specified bacterial agents as the cause of diseases classified elsewhere: Secondary | ICD-10-CM

## 2015-09-12 DIAGNOSIS — N12 Tubulo-interstitial nephritis, not specified as acute or chronic: Secondary | ICD-10-CM

## 2015-09-12 LAB — GLUCOSE, CAPILLARY
GLUCOSE-CAPILLARY: 132 mg/dL — AB (ref 65–99)
GLUCOSE-CAPILLARY: 95 mg/dL (ref 65–99)
Glucose-Capillary: 122 mg/dL — ABNORMAL HIGH (ref 65–99)

## 2015-09-12 LAB — CULTURE, BLOOD (ROUTINE X 2)
CULTURE: NO GROWTH
Culture: NO GROWTH

## 2015-09-12 MED ORDER — HEPARIN SOD (PORK) LOCK FLUSH 100 UNIT/ML IV SOLN
250.0000 [IU] | INTRAVENOUS | Status: AC | PRN
  Administered 2015-09-12: 250 [IU]

## 2015-09-12 MED ORDER — CEFTAZIDIME 1 G IJ SOLR
1.0000 g | Freq: Once | INTRAMUSCULAR | Status: AC
  Administered 2015-09-12: 1 g via INTRAVENOUS
  Filled 2015-09-12: qty 1

## 2015-09-12 MED ORDER — MAGNESIUM CITRATE PO SOLN
1.0000 | Freq: Once | ORAL | Status: AC
  Administered 2015-09-12: 1 via ORAL
  Filled 2015-09-12: qty 296

## 2015-09-12 MED ORDER — DEXTROSE 5 % IV SOLN: 1.0000 g | Freq: Three times a day (TID) | INTRAVENOUS | Status: AC

## 2015-09-12 MED ORDER — DEXTROSE 5 % IV SOLN
1.0000 g | Freq: Once | INTRAVENOUS | Status: DC
  Filled 2015-09-12: qty 1

## 2015-09-12 NOTE — Discharge Summary (Signed)
Name: Bethany Mccarthy MRN: [id number] DOB: [birth year] 50 y.o. PCP: No Pcp Per Patient  Date of Admission: 09/07/2015 12:34 PM Date of Discharge: 09/12/2015 Attending Physician: Annia Belt, MD  Discharge Diagnosis: 1. Complicated Pyelonephritis Principal Problem:   Pyelonephritis Active Problems:   Anxiety   Diabetes mellitus type 2, uncontrolled ([place]), diagnosed Nov 2016   Depression   Sepsis [facility])  Discharge Medications:   Medication List    TAKE these medications        acetaminophen-codeine 300-30 MG tablet  Commonly known as:  TYLENOL #3  Take 1 tablet by mouth every 8 (eight) hours as needed for moderate pain.     ALPRAZolam 0.25 MG tablet  Commonly known as:  XANAX  Take 1 tablet (0.25 mg total) by mouth at bedtime as needed (anxiety).     blood glucose meter kit and supplies Kit  Dispense based on patient and insurance preference. Use up to four times daily as directed. (FOR ICD-9 250.00, 250.01).     cefTAZidime 1 g in dextrose 5 % 50 mL  Inject 1 g into the vein every 8 (eight) hours.     FLUoxetine 10 MG capsule  Commonly known as:  PROZAC  Take 1 capsule (10 mg total) by mouth daily.     gabapentin 300 MG capsule  Commonly known as:  NEURONTIN  Take 1 capsule (300 mg total) by mouth 2 (two) times daily.     glimepiride 4 MG tablet  Commonly known as:  AMARYL  Take 1 tablet (4 mg total) by mouth daily with breakfast.     glucose blood test strip  Commonly known as:  TRUE METRIX BLOOD GLUCOSE TEST  Used 3 times daily before meals     insulin regular 100 units/mL injection  Commonly known as:  NOVOLIN R  Correction scale prior to meals three times a day 121-150- 2 units,  151-200- 3 units,  201-250- 5 units,  251-300- 8 units,  301-350- 11 units, 351-400- 15 units, > 400- 15 units and call MD  Bedtime correction scale: 201-250-2 units,  251-300-3 units,  301-350-4 units, 351-400-5 units, >400- 5 units and call MD     insulin starter kit-  syringes Misc  1 kit by Other route once.     Insulin Syringes (Disposable) U-100 0.5 ML Misc  Type 2 diabetes mellitus     lidocaine 5 %  Commonly known as:  LIDODERM  Place 1 patch onto the skin daily. Remove & Discard patch within 12 hours or as directed by MD     metFORMIN 1000 MG tablet  Commonly known as:  GLUCOPHAGE  Take 1 tablet (1,000 mg total) by mouth 2 (two) times daily with a meal.     pravastatin 10 MG tablet  Commonly known as:  PRAVACHOL  Take 1 tablet (10 mg total) by mouth at bedtime.     traZODone 100 MG tablet  Commonly known as:  DESYREL  Take 1 tablet (100 mg total) by mouth at bedtime as needed for sleep.     TRUE METRIX METER Devi  1 each by Does not apply route 3 (three) times daily before meals.     TRUEPLUS LANCETS 28G Misc  1 each by Does not apply route 3 (three) times daily before meals.        Disposition and follow-up:   Ms.Lubna Sedlack was discharged from [facility] in Stable condition.  At the hospital follow up visit please address:  1.   Pylenephritis: Please assess completion of IV Ceftazadime (10 day total course) with anticipated end date of 09/17/15 and symptomatic improvement.  Nephrolithiasis: Follow up scheduled with urology.  T2DM: Hgb A1c 8.8. Please assess blood glucose control, change in medication as indicated.  2.  Labs / imaging needed at time of follow-up: none  3.  Pending labs/ test needing follow-up: none  Follow-up Appointments: Follow-up Information    Follow up with [place] On 09/15/2015.   Why:  at 12:00pm for a hospital follow up appointment with Zettie Pho, PA   Contact information:   [address] [place] [address] [phone]      Follow up with [place] On 09/15/2015.   Why:  2:30 PM    Contact information:   [place] [phone]      Discharge  Instructions: Discharge Instructions    Call MD for:  persistant dizziness or light-headedness    Complete by:  As directed      Call MD for:  persistant nausea and vomiting    Complete by:  As directed      Call MD for:  severe uncontrolled pain    Complete by:  As directed      Call MD for:  temperature >100.4    Complete by:  As directed      Diet - low sodium heart healthy    Complete by:  As directed      Face-to-face encounter (required for Medicare/Medicaid patients)    Complete by:  As directed   I Zada Finders certify that this patient is under my care and that I, or a nurse practitioner or physician's assistant working with me, had a face-to-face encounter that meets the physician face-to-face encounter requirements with this patient on 09/12/2015. The encounter with the patient was in whole, or in part for the following medical condition(s) which is the primary reason for home health care (List medical condition): Pyelonephritis, requires IV antibiotics as she has allergy to oral medication options.  The encounter with the patient was in whole, or in part, for the following medical condition, which is the primary reason for home health care:  Pyelonephritis  I certify that, based on my findings, the following services are medically necessary home health services:  Nursing  Reason for Medically Necessary Home Health Services:   Skilled Nursing- Assessment and Training for Infusion Therapy, Line Care, and Infection Control Skilled Nursing- Administration and Training of Injectable Medication    My clinical findings support the need for the above services:  OTHER SEE COMMENTS  Further, I certify that my clinical findings support that this patient is homebound due to:  Pain interferes with ambulation/mobility     Home Health    Complete by:  As directed   To provide the following care/treatments:  RN     Increase activity slowly    Complete by:  As directed             Consultations:    Procedures Performed:  Dg Chest 2 View  09/07/2015  CLINICAL DATA:  Elevated blood sugar and cough, history of tobacco use EXAM: CHEST  2 VIEW COMPARISON:  07/08/2015 FINDINGS: The heart size and mediastinal contours are within normal limits. Both lungs are clear. The visualized skeletal structures are unremarkable. IMPRESSION: No active cardiopulmonary disease. Electronically Signed   By: Inez Catalina M.D.   On:  09/07/2015 13:35   Ct Renal Stone Study  09/07/2015  CLINICAL DATA:  Bilateral flank pain, left flank pain for 4 days EXAM: CT ABDOMEN AND PELVIS WITHOUT CONTRAST TECHNIQUE: Multidetector CT imaging of the abdomen and pelvis was performed following the standard protocol without IV contrast. COMPARISON:  08/10/2015 FINDINGS: Lower chest:  The lung bases are unremarkable. Hepatobiliary: No intrahepatic biliary ductal dilatation. No calcified gallstones are noted within gallbladder. No CBD dilatation. Pancreas: Unenhanced pancreas is unremarkable. Spleen: Unenhanced spleen is unremarkable. Adrenals/Urinary Tract: Low-density nodule left adrenal gland probable adenoma measures 1 cm stable in size in appearance from prior exam. The right adrenal gland is unremarkable. Unenhanced kidneys are symmetrical in size. No hydronephrosis or hydroureter. There is subtle mild left perinephric and proximal left periureteral stranding. Left urinary tract inflammation or a recent passed left ureteral calculus cannot be excluded. No definite ureteral calculi are noted bilaterally. No calcified calculi are noted within urinary bladder. Small amount of air is noted within anterior aspect of urinary bladder. This may be due to recent instrumentation or infection. Clinical correlation is necessary. Again noted punctate nonobstructive renal calcifications bilateral without significant change from prior exam. The largest nonobstructive calculus in midpole of the right kidney measures 4 mm. The largest  punctate calculus in upper pole of the left kidney measures 2 mm. Stomach/Bowel: No gastric outlet obstruction. There is subtle mild distension of distal jejunum with some fluid without thickening of small bowel wall. Mild enteritis or ileus cannot be excluded. No definite evidence of small bowel obstruction. Moderate stool noted in right colon transverse colon and descending colon. Surgical clips within retroperitoneum and bilateral posterior pelvis and anterior pelvis again noted. Vascular/Lymphatic: No retroperitoneal or mesenteric adenopathy. No aortic aneurysm. Atherosclerotic calcifications are noted abdominal aorta and iliac arteries. Reproductive: The patient is status post hysterectomy Other: No ascites or free air.  No inguinal adenopathy. Musculoskeletal: No destructive bony lesions are noted. Sagittal images of the spine shows stable minimal compression deformity upper endplate of L1 vertebral body. IMPRESSION: 1. Again noted bilateral nonobstructive nephrolithiasis. No hydronephrosis or hydroureter. No definite calcified ureteral calculi. There is minimal left perinephric stranding and left proximal periureteral stranding. Subtle left urinary tract inflammation or a recent passed left ureteral calculus cannot be excluded. Clinical correlation is necessary. 2. There is no evidence of calcified calculi within urinary bladder. Small amount of air within anterior aspect of the bladder may be post recent instrumentation. Infection cannot be excluded. Clinical correlation is necessary. 3. Status post hysterectomy. 4. Stable minimal compression deformity upper endplate of L1 vertebral body. 5. Minimal distension of distal jejunum with some fluid without thickening of jejunal wall. Mild ileus or enteritis cannot be excluded. No definite evidence of small bowel obstruction free Electronically Signed   By: Lahoma Crocker M.D.   On: 09/07/2015 14:58    2D Echo:   Cardiac Cath:   Admission HPI:  Ms. Dariona Postma is a 50 year old female with PMH of T2DM, HLD, C5-C6 radiculopathy, cervical cancer s/p hysterectomy and oopherectomy, renal stones, and pyelonephritis who presents with back pain. She says the pain began 4 days ago and radiates to her abdomen, described as a burning, "fluttering" sensation that is 10/10 on intensity. She says the pain is worse with walking and improves when she is lying down with her legs folded so that her knees are up. She has tried [employer] powders and Pepto-bismal without relief. She says she has had numerous kidney stones in the past requiring  lithotripsy. She was recently hospitalized from November 7th-10th 2016 for bilateral pyelonephritis at which time urine culture grew E coli. She was treated with IV Ceftriaxone and transitioned to oral Keflex. She says her current pain feels more like her prior kidney stones than her prior kidney infection. She has associated dysuria, nausea, vomiting, subjective fever, headache, myalgias, chills, urinary frequency, increased thirst. She denies chest pain, SOB, diarrhea, or diaphoresis.  At previous hospitalization, patient was newly diagnosed with T2DM with a Hgb A1C of 10.1. She reports taking Metformin 1000 mg BID and sliding scale insulin. She was prescribed Glimepiride as well, but she is unsure if she is taking this. She checks her blood sugars at home and reports usual numbers around 140.  She is a current smoker of 3 cigarettes a day, down from 1 PPD since age of 6. She denies alcohol or illicit drug use. She denies any sick contacts. She reports a family history of kidney stones in her paternal aunts and a paternal aunt who died of lung cancer. She reports allergies to Levaquin and Sulfa drugs which cause her to break out in hives.  In the ED, vitals were: BP 141/78 mmHg  Pulse 116  Temp(Src) 99.9 F (37.7 C) (Oral)  Resp 18  SpO2 100% Urine showed positive nitrites, small leukocytes, many bacteria, ketones, and numerous WBCs.  She had a leukocytosis of 22,000 and lactic acid of 3.57. She was given two 1 L boluses of NS and 2 grams IV Ceftriaxone. CT renal stone study showed bilateral nonobstructive nephrolithiasis which was noted on previous study last month. Also shows minimal left perinephric stranding and periureteral stranding. Small amount of air is noted in the anterior bladder.   Hospital Course by problem list: Principal Problem:   Pyelonephritis Active Problems:   Anxiety   Diabetes mellitus type 2, uncontrolled ([place]), diagnosed Nov 2016   Depression   Sepsis ([place])   Complicated Pyelonephritis: Patient with b/l CVA tenderness and history of pyelonephritis and renal stones. She had an elevated white count of 22,000. CT showed bilateral nonobstructive nephrolithiasis with perinephric stranding. Urine culture grew pseudomonas pan-sensitive including Cefepime, Ceftazadime, and Cipro. IV Ceftriaxone was switched to Ceftazidime on 3/23. Patient has reported allergy to Levofloxacin with hives including around her oral mucosa which limited fluoroquinolone use when transitioning to oral meds. No other appropriate oral antibiotics were considered appropriate after discussion with ID pharmacy. PICC was placed for home antibiotic administration as IV abx were the only reasonable option. She was discharged with home health to complete 10 days total antibiotic course outpatient. Urology follow up was scheduled for her nephrolithiasis.  Uncontrolled Type 2 DM with DKA: Patient diagnosed in November 2016 on last hospital admission with Hgb A1C of 10.1. Takes Metformin 1000 mg BID and sliding scale insulin at home with reported blood sugars around 140. Serum glucose on presentation was 432 with an anion gap of 17. UA showed ketonuria and lactic acid was elevated at 3.57. Patient likely had uncomplicated DKA secondary to acute infection. She was on insulin drip per DKA protocol and transitioned to Lantus and oral diet after 1 day.  Repeat BMET showed gap of 13.   Discharge Vitals:   BP 125/64 mmHg  Pulse 72  Temp(Src) 98.5 F (36.9 C) (Oral)  Resp 18  Ht 5' 6"  (1.676 m)  Wt 143 lb 1.6 oz (64.91 kg)  BMI 23.11 kg/m2  SpO2 98%  Discharge Labs:  Results for orders placed or performed during the hospital encounter  of 09/07/15 (from the past 24 hour(s))  Glucose, capillary     Status: None   Collection Time: 09/12/15  4:53 PM  Result Value Ref Range   Glucose-Capillary 95 65 - 99 mg/dL    Signed: Zada Finders, MD 09/13/2015, 2:23 PM    Services Ordered on Discharge: HHRN Equipment Ordered on Discharge: none

## 2015-09-12 NOTE — Progress Notes (Signed)
Discharge instructions and medications discussed with patient.  All questions answered.  

## 2015-09-12 NOTE — Telephone Encounter (Signed)
Call received from Carolynn Sayers, [place]  Infusion Liaison.  She requested a hospital follow up appointment for the patient at [facility] as the patient will be discharging home with IV antibiotics and does not have a PCP. An appointment was scheduled for 09/15/15 @ 1200 and the information was place on the AVS. There were no other appointments scheduled for the patient at [facility].  Update provided to P. Tamera Punt, RN.

## 2015-09-12 NOTE — Care Management Note (Signed)
Case Management Note  Patient Details  Name: Bethany Mccarthy MRN: [id number] Date of Birth: 11-01-1965  Subjective/Objective:                 CM following for progression and d/c planning.   Action/Plan: 09/12/2015 [facility] following pt and have approved for [facility] and IV antibiotics under [facility]. Prescription received , [facility] aware of plan to complete current dose, given another dose at 6pm. D/c the pt this pm and [facility] services to begin on 09/13/2015 am.   Expected Discharge Date:      09/12/2015            Expected Discharge Plan:  [place]  In-House Referral:  NA  Discharge planning Services  CM Consult, [facility]  Post Acute Care Choice:    Choice offered to:  NA, Patient  DME Arranged:  IV pump/equipment DME Agency:  [place]:  RN [facility] Agency:  [place]  Status of Service:  Completed, signed off  Medicare Important Message Given:    Date Medicare IM Given:    Medicare IM give by:    Date Additional Medicare IM Given:    Additional Medicare Important Message give by:     If discussed at [place] of Stay Meetings, dates discussed:    Additional Comments:  Adron Bene, RN 09/12/2015, 3:19 PM

## 2015-09-12 NOTE — Progress Notes (Signed)
   Subjective: No acute events overnight. Patient reports some continued pain at her right CVA. She is tolerating oral intake well. She feels comfortable to go home with PICC for home IV antibiotics to complete a 10 day course.  Objective: Vital signs in last 24 hours: Filed Vitals:   09/11/15 1814 09/11/15 2140 09/12/15 0456 09/12/15 0800  BP: 98/56 125/65 108/53 112/62  Pulse: 75 58 58 64  Temp: 97.4 F (36.3 C) 98.8 F (37.1 C) 97.8 F (36.6 C) 97.4 F (36.3 C)  TempSrc: Oral Oral Oral Oral  Resp: 18 17 18 18   Height:      Weight:      SpO2: 100% 97% 99% 98%   Weight change:   Intake/Output Summary (Last 24 hours) at 09/12/15 1428 Last data filed at 09/12/15 1000  Gross per 24 hour  Intake    660 ml  Output      0 ml  Net    660 ml   General: sitting up in bed, no acute distress Cardiac: RRR, no rubs, murmurs or gallops Pulm: clear to auscultation bilaterally, moving normal volumes of air Abd: soft, nontender, nondistended, BS present Ext: PICC RUE, no pedal edema    Assessment/Plan: 50 year old woman hospitalized 04/2015 for pyelonephritis, hx of DM presenting with abdominal pain, dysuria.  Complicated Pyelonephritis: Urine culture growing pseudomonas pan-sensitive including Cefepime, Ceftazadime, and Cipro. IV Ceftriaxone switched to Ceftazidime 3/23. Will need antibiotics for 10 days. Patient reports allergy to Levofloxacin with hives including around her oral mucosa which limits fluoroquinolone use when transitioning to oral meds. PICC placed for home antibiotic administration as IV abx are only reasonable option. Case management consulted for home health arrangements, appreciate assistance. -Continue IV Ceftazidime 1 gram TID (3/23 >> 4/1) -Tylenol #3 q8h prn pain -Ibuprofen 600 mg q6h prn -Norco 5-325 mg q8h prn severe pain -Outpatient referral to urology for her urolithiasis.  Type 2 DM with DKA: Patient diagnosed in November 2016 on last hospital admission  with Hgb A1C of 10.1. A1C is 8.8 this admission. DKA resolved. -Continue Lantus 20 units daily -SSI-S -Carb mod diet  HLD: -Pravastatin 10mg  qd  Depression/Anxiety: -Xanax 0.25 mg prn qhs -Fluoxetine 10 mg po qhs  C5-C6 radiculopathy: -Gabapentin 300 mg po BID  Dispo: Discharge to home today with PICC to complete IV antibiotics.    LOS: 5 days   Zada Finders, MD 09/12/2015, 2:28 PM

## 2015-09-12 NOTE — Discharge Instructions (Signed)
Please complete the Ceftazadime antibiotic course. Please also follow up at [facility] on 09/15/15.

## 2015-09-12 NOTE — Progress Notes (Signed)
Subjective: No acute events overnight. Patient says she is still in pain, but was able to sit up without obvious discomfort today. She has been tolerating her advanced diet well and denies nausea and vomiting. She says she has not had a bowel movement in three days and requests something to help move her bowels. Her PICC line was placed 3/25 for IV antibiotics and she says she feels well enough to go home today.  Objective: Vital signs in last 24 hours: Filed Vitals:   09/11/15 1814 09/11/15 2140 09/12/15 0456 09/12/15 0800  BP: 98/56 125/65 108/53 112/62  Pulse: 75 58 58 64  Temp: 97.4 F (36.3 C) 98.8 F (37.1 C) 97.8 F (36.6 C) 97.4 F (36.3 C)  TempSrc: Oral Oral Oral Oral  Resp: 18 17 18 18   Height:      Weight:      SpO2: 100% 97% 99% 98%   Physical Exam: General: Well-nourished female in no acute distress.  CV: Regular rate and rhythm. Normal S1, S2. No murmur, rub, or gallop.  Pulm: Lungs clear to auscultation bilaterally. Abdomen: Soft, non-tender. Hypoactive bowel sounds. No masses noted.  CBG:  Recent Labs Lab 09/11/15 0747 09/11/15 1129 09/11/15 1649 09/11/15 2113 09/12/15 0737 09/12/15 1219  GLUCAP 169* 252* 170* 147* 132* 122*   Hemoglobin A1C:  Recent Labs Lab 09/07/15 1019  HGBA1C 8.8*   Micro Results: Recent Results (from the past 240 hour(s))  Urine culture     Status: None   Collection Time: 09/07/15 10:25 AM  Result Value Ref Range Status   Specimen Description URINE, CLEAN CATCH  Final   Special Requests NONE  Final   Culture >=100,000 COLONIES/mL PSEUDOMONAS AERUGINOSA  Final   Report Status 09/09/2015 FINAL  Final   Organism ID, Bacteria PSEUDOMONAS AERUGINOSA  Final      Susceptibility   Pseudomonas aeruginosa - MIC*    CEFTAZIDIME 4 SENSITIVE Sensitive     CIPROFLOXACIN <=0.25 SENSITIVE Sensitive     GENTAMICIN <=1 SENSITIVE Sensitive     IMIPENEM 1 SENSITIVE Sensitive     PIP/TAZO 8 SENSITIVE Sensitive     CEFEPIME 2 SENSITIVE  Sensitive     * >=100,000 COLONIES/mL PSEUDOMONAS AERUGINOSA  Blood Culture (routine x 2)     Status: None   Collection Time: 09/07/15  2:10 PM  Result Value Ref Range Status   Specimen Description BLOOD RIGHT WRIST  Final   Special Requests BOTTLES DRAWN AEROBIC AND ANAEROBIC 5CC  Final   Culture NO GROWTH 5 DAYS  Final   Report Status 09/12/2015 FINAL  Final  Blood Culture (routine x 2)     Status: None   Collection Time: 09/07/15  2:49 PM  Result Value Ref Range Status   Specimen Description BLOOD LEFT HAND  Final   Special Requests BOTTLES DRAWN AEROBIC AND ANAEROBIC 5CC  Final   Culture NO GROWTH 5 DAYS  Final   Report Status 09/12/2015 FINAL  Final   Medications: I have reviewed the patient's current medications. Scheduled Meds: . cefTAZidime (FORTAZ)  IV  1 g Intravenous 3 times per day  . enoxaparin (LOVENOX) injection  40 mg Subcutaneous Q24H  . FLUoxetine  10 mg Oral QHS  . gabapentin  300 mg Oral BID  . glimepiride  4 mg Oral Q breakfast  . ibuprofen  600 mg Oral TID WC & HS  . insulin aspart  0-5 Units Subcutaneous QHS  . insulin aspart  0-9 Units Subcutaneous TID WC  . insulin  glargine  20 Units Subcutaneous Daily  . lidocaine  1 patch Transdermal Q24H  . metFORMIN  1,000 mg Oral BID WC  . potassium chloride  40 mEq Oral BID  . pravastatin  10 mg Oral QHS  . sodium chloride flush  10-40 mL Intracatheter Q12H   Continuous Infusions: . sodium chloride 20 mL/hr at 09/10/15 1729   PRN Meds:.ALPRAZolam, HYDROcodone-acetaminophen, ondansetron **OR** ondansetron (ZOFRAN) IV, sodium chloride flush Assessment/Plan: Principal Problem:   Pyelonephritis Active Problems:   Anxiety   Diabetes mellitus type 2, uncontrolled ([place]), diagnosed Nov 2016   Depression   Sepsis ([place])  Pyelonephritis: Her urine culture grew pan-sensitive Pseudomonas aeruginosa.  Blood cultures were negative. IV ceftriaxone switched to IV ceftazidime 3/23. Patient is allergic to levofloxacin, so a  PICC line was placed 3/25 to continue IV ceftazidime at home. Case management made home help arrangements upon discharge. - Day 3 of 10 of ceftazidime IV. - Pain management with Toradol 15 mg IV q6 hr PRN pain and morphine 2 mg q3 hr PRN breakthrough pain. - Outpatient referral to urology for nephrolithiasis.  Type II diabetes mellitus complicated by diabetic ketoacidosis: Upon admission, her blood glucose was 432, her anion gap was 17, and urine ketones were 40.Her gap was closed with IVF and insulin IV. Her CBG has been between 130-250 since. - SSI.  Hyperlipidemia:  - Continue pravastatin 10 mg tablet PO at bedtime.  Anxiety: - Continue fluoxetine 10 mg PO at bedtime. - Alprazolam 0.25 mg tablet PO at bedtime PRN anxiety.  Insomnia:  - Continue trazodone 100 mg PO at bedtime PRN insomnia.  Diet: Regular DVT Ppx: Lovenox Code status: Full  Dispo: Discharge anticipated today.  This is a Careers information officer Note.  The care of the patient was discussed with Dr. Posey Pronto and the assessment and plan formulated with their assistance.  Please see their attached note for official documentation of the daily encounter.   LOS: 5 days   Kreg Shropshire, Med Student 09/12/2015, 1:34 PM

## 2015-09-13 ENCOUNTER — Telehealth: Payer: Self-pay | Admitting: General Practice

## 2015-09-13 ENCOUNTER — Telehealth: Payer: Self-pay

## 2015-09-13 NOTE — Telephone Encounter (Signed)
This CM spoke to Dr Jarold Song regarding the need for signed orders for home IV therapy with [facility]).  An appointment has been scheduled for the patient at [facility] on 09/15/15 @ 1100 with Zettie Pho, PA.  Dr Jarold Song agreed to sign the home infusion orders and requests that the patient schedule an appointment for follow up with her ( Dr Jarold Song) after she sees  Ms Ena Dawley, [place].  Call placed to Carolynn Sayers, RN  - Infusion Liaison with [facility] to inform her that Dr Jarold Song has agreed to sign the orders for home infusion.  A  voice mail message was left requesting a call back.

## 2015-09-13 NOTE — Telephone Encounter (Signed)
Bethany Mccarthy from [place] is need order to continued home health for IV and antibiotics

## 2015-09-14 ENCOUNTER — Telehealth: Payer: Self-pay

## 2015-09-14 NOTE — Telephone Encounter (Signed)
This Case Manager placed call to Carolynn Sayers, Infusion Coordinator with [facility], to inform her that Dr. Jarold Song is agreeable to signing patient's home infusion orders. Carolynn Sayers verbalized understanding. Per Eden Lathe, RN CM, patient will need a follow-up appointment with Dr. Jarold Song after appointment on 09/15/15 with Zettie Pho, PA.  Communication sent to Zettie Pho, PA providing update. This Case Manager attempted to place call to patient at [phone] to remind her of upcoming appointment; however, unable to reach patient or leave message as "person you are calling cannot accept calls at this time."

## 2015-09-14 NOTE — Telephone Encounter (Signed)
MA spoke with Laquane in regards to patient receiving IV antibiotics in the home. MA gave VO for home visits to continue for monitoring the patient. No further concerns at this time.

## 2015-09-15 ENCOUNTER — Ambulatory Visit: Payer: Self-pay | Attending: Family Medicine | Admitting: Physician Assistant

## 2015-09-15 ENCOUNTER — Encounter: Payer: Self-pay | Admitting: Physician Assistant

## 2015-09-15 ENCOUNTER — Ambulatory Visit (HOSPITAL_BASED_OUTPATIENT_CLINIC_OR_DEPARTMENT_OTHER): Payer: Self-pay | Admitting: Clinical

## 2015-09-15 VITALS — BP 102/67 | HR 82 | Temp 98.6°F | Resp 18 | Ht 66.0 in | Wt 144.4 lb

## 2015-09-15 DIAGNOSIS — K219 Gastro-esophageal reflux disease without esophagitis: Secondary | ICD-10-CM | POA: Insufficient documentation

## 2015-09-15 DIAGNOSIS — G8929 Other chronic pain: Secondary | ICD-10-CM | POA: Insufficient documentation

## 2015-09-15 DIAGNOSIS — Z79899 Other long term (current) drug therapy: Secondary | ICD-10-CM | POA: Insufficient documentation

## 2015-09-15 DIAGNOSIS — Z716 Tobacco abuse counseling: Secondary | ICD-10-CM | POA: Insufficient documentation

## 2015-09-15 DIAGNOSIS — F418 Other specified anxiety disorders: Secondary | ICD-10-CM

## 2015-09-15 DIAGNOSIS — M545 Low back pain: Secondary | ICD-10-CM | POA: Insufficient documentation

## 2015-09-15 DIAGNOSIS — Z7984 Long term (current) use of oral hypoglycemic drugs: Secondary | ICD-10-CM | POA: Insufficient documentation

## 2015-09-15 DIAGNOSIS — M5010 Cervical disc disorder with radiculopathy, unspecified cervical region: Secondary | ICD-10-CM

## 2015-09-15 DIAGNOSIS — F329 Major depressive disorder, single episode, unspecified: Secondary | ICD-10-CM

## 2015-09-15 DIAGNOSIS — Z9889 Other specified postprocedural states: Secondary | ICD-10-CM | POA: Insufficient documentation

## 2015-09-15 DIAGNOSIS — G4700 Insomnia, unspecified: Secondary | ICD-10-CM | POA: Insufficient documentation

## 2015-09-15 DIAGNOSIS — Z882 Allergy status to sulfonamides status: Secondary | ICD-10-CM | POA: Insufficient documentation

## 2015-09-15 DIAGNOSIS — N200 Calculus of kidney: Secondary | ICD-10-CM | POA: Insufficient documentation

## 2015-09-15 DIAGNOSIS — F32A Depression, unspecified: Secondary | ICD-10-CM

## 2015-09-15 DIAGNOSIS — F17200 Nicotine dependence, unspecified, uncomplicated: Secondary | ICD-10-CM

## 2015-09-15 DIAGNOSIS — E7800 Pure hypercholesterolemia, unspecified: Secondary | ICD-10-CM

## 2015-09-15 DIAGNOSIS — Z794 Long term (current) use of insulin: Secondary | ICD-10-CM | POA: Insufficient documentation

## 2015-09-15 DIAGNOSIS — N10 Acute tubulo-interstitial nephritis: Secondary | ICD-10-CM | POA: Insufficient documentation

## 2015-09-15 DIAGNOSIS — E1165 Type 2 diabetes mellitus with hyperglycemia: Secondary | ICD-10-CM | POA: Insufficient documentation

## 2015-09-15 DIAGNOSIS — E118 Type 2 diabetes mellitus with unspecified complications: Secondary | ICD-10-CM

## 2015-09-15 DIAGNOSIS — Z881 Allergy status to other antibiotic agents status: Secondary | ICD-10-CM | POA: Insufficient documentation

## 2015-09-15 DIAGNOSIS — Z8542 Personal history of malignant neoplasm of other parts of uterus: Secondary | ICD-10-CM | POA: Insufficient documentation

## 2015-09-15 DIAGNOSIS — F419 Anxiety disorder, unspecified: Secondary | ICD-10-CM | POA: Insufficient documentation

## 2015-09-15 DIAGNOSIS — Z720 Tobacco use: Secondary | ICD-10-CM | POA: Insufficient documentation

## 2015-09-15 DIAGNOSIS — Z90710 Acquired absence of both cervix and uterus: Secondary | ICD-10-CM | POA: Insufficient documentation

## 2015-09-15 LAB — GLUCOSE, POCT (MANUAL RESULT ENTRY): POC Glucose: 217 mg/dl — AB (ref 70–99)

## 2015-09-15 MED ORDER — PRAVASTATIN SODIUM 10 MG PO TABS: 10.0000 mg | ORAL_TABLET | Freq: Every day | ORAL | Status: AC

## 2015-09-15 MED ORDER — FLUOXETINE HCL 10 MG PO CAPS: 10.0000 mg | ORAL_CAPSULE | Freq: Every day | ORAL | Status: AC

## 2015-09-15 MED ORDER — ACETAMINOPHEN-CODEINE #3 300-30 MG PO TABS: 1.0000 | ORAL_TABLET | Freq: Three times a day (TID) | ORAL | Status: AC | PRN

## 2015-09-15 MED ORDER — TRAZODONE HCL 100 MG PO TABS: 100.0000 mg | ORAL_TABLET | Freq: Every evening | ORAL | Status: AC | PRN

## 2015-09-15 MED ORDER — GABAPENTIN 300 MG PO CAPS: 300.0000 mg | ORAL_CAPSULE | Freq: Two times a day (BID) | ORAL | Status: AC

## 2015-09-15 MED ORDER — METFORMIN HCL 1000 MG PO TABS: 1000.0000 mg | ORAL_TABLET | Freq: Two times a day (BID) | ORAL | Status: AC

## 2015-09-15 MED ORDER — GLIMEPIRIDE 4 MG PO TABS: 4.0000 mg | ORAL_TABLET | Freq: Every day | ORAL | Status: AC

## 2015-09-15 MED FILL — PRAVASTATIN SODIUM 10 MG TA: 10 | 30 days supply | Qty: 30 | Fill #0

## 2015-09-15 MED FILL — ACETAMINOPHEN/COD #3 TABLET: 300-30 | 20 days supply | Qty: 60 | Fill #0

## 2015-09-15 MED FILL — GABAPENTIN 300 MG CAPSULE: 300 | 30 days supply | Qty: 60 | Fill #0

## 2015-09-15 MED FILL — traZODone HCL 100 MG TABS: 100 | 30 days supply | Qty: 30 | Fill #0

## 2015-09-15 MED FILL — FLUoxetine HCL 10 MG CAPS: 10 | 30 days supply | Qty: 30 | Fill #0

## 2015-09-15 MED FILL — GLIMEPIRIDE 4 MG TABLET: 4 | 30 days supply | Qty: 30 | Fill #0

## 2015-09-15 MED FILL — metFORMIN HCL 1000 MG TABS: 1000 | 30 days supply | Qty: 60 | Fill #0

## 2015-09-15 NOTE — Progress Notes (Signed)
Patient is here for HFU for Sepsis  Patient complains of lower back pain being present scaled currently at a 7. Pain is described as aching and throbbing. Patient states sitting causes stabbing pains.  Patient request refills on Tylenol 3, Gabapentin, Glimepiride, Pravastatin, Desyrel.  Patient states she took IV antibiotics and BC powder this morning. Patient has not eaten today.  Patient declines flu shot today.

## 2015-09-15 NOTE — Progress Notes (Signed)
Bethany Mccarthy  VQM:[id number]  [id number]:[id number]  DOB - Jun 26, 1965  Chief Complaint  Patient presents with  . Hospitalization Follow-up       Subjective:   Bethany Mccarthy is a 50 y.o. female here today for hospital follow up. She was hospitalized March 22 of March 27. She initially presented with abdominal pain, flank pain, and back pain. She also had been having some dysuria, frequency and nausea and vomiting. She had fevers and chills. Her heart rate was 116 on presentation. Her temperature is 99.9. Her other vital signs are stable. Her blood sugar was 432. Her white blood cell count was 22,000. She had ketones in her urine. Her A1c was 8.8%. Her urinalysis was abnormal. Her CT scan of the abdomen and pelvis showed bilateral nonobstructive nephrolithiasis. She was admitted by the internal medicine team with urosepsis and acute pyelonephritis. She also was admitted for uncontrolled diabetes and started on an insulin drip. She was discharged on IV antibiotics through a PICC line. Home health nursing has been assisting with this. She feels a little better. Still occasionally she has some chills. Her back pain is not completely resolved. She is supposed to see urology today.   ROS: GEN: denies fever or chills, denies change in weight  LUNGS: denies SHOB, dyspnea, PND, orthopnea CV: denies CP or palpitations ABD: denies abd pain, N or V EXT: denies muscle spasms or swelling; no pain in lower ext, no weakness NEURO: denies numbness or tingling, denies sz, stroke or TIA  ALLERGIES: Allergies  Allergen Reactions  . Levaquin [Levofloxacin In D5w] Hives  . Sulfa Antibiotics Hives    PAST MEDICAL HISTORY: Past Medical History  Diagnosis Date  . Anxiety   . Insomnia   . Tobacco abuse   . Pyelonephritis     Archie Endo 04/25/2015  . Chronic bronchitis ([place])   . GERD (gastroesophageal reflux disease)   . Chronic lower back pain   . Uterine cancer ([place])     PAST SURGICAL HISTORY: Past  Surgical History  Procedure Laterality Date  . Foot fracture surgery Right 2015  . Fracture surgery    . Abdominal hysterectomy  1993  . Tubal ligation  1993  . Bilateral oophorectomy  2013    MEDICATIONS AT HOME: Prior to Admission medications   Medication Sig Start Date End Date Taking? Authorizing Provider  acetaminophen-codeine (TYLENOL #3) 300-30 MG tablet Take 1 tablet by mouth every 8 (eight) hours as needed for moderate pain. 09/15/15  Yes Hashir Deleeuw Daneil Dan, PA-C  ALPRAZolam (XANAX) 0.25 MG tablet Take 1 tablet (0.25 mg total) by mouth at bedtime as needed (anxiety). 04/28/15  Yes Ripudeep Krystal Eaton, MD  blood glucose meter kit and supplies KIT Dispense based on patient and insurance preference. Use up to four times daily as directed. (FOR ICD-9 250.00, 250.01). 04/28/15  Yes Ripudeep K Rai, MD  Blood Glucose Monitoring Suppl (TRUE METRIX METER) DEVI 1 each by Does not apply route 3 (three) times daily before meals. 05/31/15  Yes Arnoldo Morale, MD  cefTAZidime 1 g in dextrose 5 % 50 mL Inject 1 g into the vein every 8 (eight) hours. 09/12/15  Yes Zada Finders, MD  FLUoxetine (PROZAC) 10 MG capsule Take 1 capsule (10 mg total) by mouth daily. 09/15/15  Yes Sangeeta Youse Daneil Dan, PA-C  gabapentin (NEURONTIN) 300 MG capsule Take 1 capsule (300 mg total) by mouth 2 (two) times daily. 09/15/15  Yes Malikah Principato Daneil Dan, PA-C  glimepiride (AMARYL) 4 MG tablet Take 1 tablet (  4 mg total) by mouth daily with breakfast. 09/15/15  Yes Rabecca Birge Daneil Dan, PA-C  glucose blood (TRUE METRIX BLOOD GLUCOSE TEST) test strip Used 3 times daily before meals 05/31/15  Yes Arnoldo Morale, MD  insulin regular (NOVOLIN R) 100 units/mL injection Correction scale prior to meals three times a day 121-150- 2 units,  151-200- 3 units,  201-250- 5 units,  251-300- 8 units,  301-350- 11 units, 351-400- 15 units, > 400- 15 units and call MD  Bedtime correction scale: 201-250-2 units,  251-300-3 units,  301-350-4 units, 351-400-5 units, >400- 5  units and call MD 04/28/15  Yes Ripudeep Krystal Eaton, MD  insulin starter kit- syringes MISC 1 kit by Other route once. 04/28/15  Yes Ripudeep Krystal Eaton, MD  Insulin Syringes, Disposable, U-100 0.5 ML MISC Type 2 diabetes mellitus 04/28/15  Yes Ripudeep K Rai, MD  lidocaine (LIDODERM) 5 % Place 1 patch onto the skin daily. Remove & Discard patch within 12 hours or as directed by MD Patient taking differently: Place 1 patch onto the skin daily as needed (for lower back). Remove & Discard patch within 12 hours or as directed by MD 04/28/15  Yes Ripudeep Krystal Eaton, MD  metFORMIN (GLUCOPHAGE) 1000 MG tablet Take 1 tablet (1,000 mg total) by mouth 2 (two) times daily with a meal. 09/15/15  Yes Eily Louvier S Daquavion Catala, PA-C  pravastatin (PRAVACHOL) 10 MG tablet Take 1 tablet (10 mg total) by mouth at bedtime. 09/15/15  Yes Villa Burgin Daneil Dan, PA-C  traZODone (DESYREL) 100 MG tablet Take 1 tablet (100 mg total) by mouth at bedtime as needed for sleep. 09/15/15  Yes Shemaiah Round Daneil Dan, PA-C  TRUEPLUS LANCETS 28G MISC 1 each by Does not apply route 3 (three) times daily before meals. 05/31/15  Yes Arnoldo Morale, MD     Objective:   Filed Vitals:   09/15/15 1239  BP: 102/67  Pulse: 82  Temp: 98.6 F (37 C)  TempSrc: Oral  Resp: 18  Height: 5' 6"  (1.676 m)  Weight: 144 lb 6.4 oz (65.499 kg)  SpO2: 100%    Exam General appearance : Awake, alert, not in any distress. Speech Clear. Not toxic looking Neck: supple, no JVD. No cervical lymphadenopathy.  Chest:Good air entry bilaterally, no added sounds  CVS: S1 S2 regular, no murmurs.  Abdomen: Bowel sounds present, diffusely tender and not distended with no gaurding, rigidity or rebound. Extremities: B/L Lower Ext shows no edema, both legs are warm to touch Neurology: Awake alert, and oriented X 3, CN II-XII intact, Non focal Skin:No Rash Wounds:N/A  Data Review Lab Results  Component Value Date   HGBA1C 8.8* 09/07/2015   HGBA1C 10.1* 04/25/2015     Assessment & Plan    1. Acute Pyelonephritis  -Cont IV Antibiotics for 1 more week  -[facility] assisting with PICC line and med admin  -Appt with Urology later today  -pain meds prn 2. IDDM-not controlled  -Aim for 30 minutes of exercise most days. Rethink what you drink. Water is great! Aim for 2-3 Carb Choices per meal (30-45 grams) +/- 1 either way  Aim for 0-15 Carbs per snack if hungry  Include protein in moderation with your meals and snacks  Consider reading food labels for Total Carbohydrate and Fat Grams of foods  Consider checking BG at alternate times per day  Continue taking medication as directed Be mindful about how much sugar you are adding to beverages and other foods. Fruit Punch - find one with no  sugar  Measure and decrease portions of carbohydrate foods  Make your plate and don't go back for seconds   -refilled meds  -Fingersticks with log and bring to next appt  -compliance encouraged 3. Smoker  -cessation discussed, not interested 4. Depression m/w anxiety  -refilled Prozac  -asked LCSW to see today    Return in about 2 weeks (around 09/29/2015).  The patient was given clear instructions to go to ER or return to medical center if symptoms don't improve, worsen or new problems develop. The patient verbalized understanding. The patient was told to call to get lab results if they haven't heard anything in the next week.   This note has been created with Surveyor, quantity. Any transcriptional errors are unintentional.    Zettie Pho, PA-C [facility] [place], [place]   09/15/2015, 12:49 PM

## 2015-09-15 NOTE — Progress Notes (Signed)
ASSESSMENT: Pt currently experiencing symptoms of anxiety and depression. Pt needs to f/u with PCP and [facility]. Pt would benefit from psychoeducation and brief therapeutic interventions regarding coping with symptoms of anxiety and depression; may benefit from community resources.  Stage of Change: contemplative  PLAN: 1. F/U with behavioral health consultant in as needed 2. Psychiatric Medications:  Prozac(start today) 3. Behavioral recommendation(s):   -Consider [employer] for additional social [place] for group support, as needed -Continue to care for friend, while remembering importance of self care -Consider reading educational material regarding coping with symptoms of anxiety and depression -Schedule appointment with [facility] financial counseling to apply for orange card SUBJECTIVE: Pt. referred by Zettie Pho, PA-C for symptoms of anxiety and depression:  Pt. reports the following symptoms/concerns: Pt states that she attributes recent feelings of anxiety and depression to financial difficulties, uninsured status, and recent hospitalization; also admits to feeling lonely with no family nearby. Pt states she used to go to [facility] for anxiety and depression, but has not gone in over a year. She says she would feel better if she is able to get on orange card and knows she is able to get healthcare; also copes by caring for 48year old woman, who is "like family".  Duration of problem: At least one week  Severity: moderate  OBJECTIVE: Orientation & Cognition: Oriented x3. Thought processes normal and appropriate to situation. Mood: appropriate. Affect: appropriate Appearance: appropraite Risk of harm to self or others: no known risk of harm to self or others Substance use: tobacco Assessments administered: PHQ9: 8/ GAD7: 11  Diagnosis: Anxiety and depression CPT Code: F41.8 -------------------------------------------- Other(s) present in the  room: none  Time spent with patient in exam room: 20 minutes, 12:40-1:00   Depression screen [facility] 2/9 09/15/2015 05/31/2015  Decreased Interest 0 3  Down, Depressed, Hopeless 1 3  PHQ - 2 Score 1 6  Altered sleeping 3 3  Tired, decreased energy 1 3  Change in appetite 1 3  Feeling bad or failure about yourself  0 3  Trouble concentrating 1 3  Moving slowly or fidgety/restless 1 3  Suicidal thoughts 0 0  PHQ-9 Score 8 24    GAD 7 : Generalized Anxiety Score 09/15/2015 05/31/2015  Nervous, Anxious, on Edge 2 3  Control/stop worrying 2 3  Worry too much - different things 2 3  Trouble relaxing 2 3  Restless 1 3  Easily annoyed or irritable 2 3  Afraid - awful might happen 0 3  Total GAD 7 Score 11 21  Anxiety Difficulty - Somewhat difficult

## 2015-09-20 ENCOUNTER — Telehealth: Payer: Self-pay | Admitting: General Practice

## 2015-09-20 NOTE — Telephone Encounter (Signed)
Tiffany from [facility] called requesting verbal orders.  Verbal or for a Education officer, museum and  Verbal orders pick line to be removed once antibiotics are complete. Please follow up.

## 2015-09-20 NOTE — Telephone Encounter (Signed)
Called and gave verbal orders 

## 2015-09-21 ENCOUNTER — Telehealth: Payer: Self-pay

## 2015-09-21 NOTE — Telephone Encounter (Signed)
Call placed to [facility], Pharmacist [place] # [phone], who confirmed that the patient competed her course of IV antibiotics on 09/18/2015

## 2015-09-22 ENCOUNTER — Telehealth: Payer: Self-pay

## 2015-09-22 NOTE — Telephone Encounter (Signed)
This Case Manager placed call to patient to remind patient of need for follow-up appointment and establishment of care with Dr. Jarold Song. Patient completed home IV antibiotics on 09/18/15.  Spoke with patient and appointment scheduled for 10/10/15 at 1200 with Dr. Jarold Song. Patient appreciative of appointment. No additional needs identified.

## 2015-10-10 ENCOUNTER — Ambulatory Visit: Payer: Self-pay | Admitting: Family Medicine

## 2016-08-06 IMAGING — CT CT RENAL STONE PROTOCOL
2 of 4 series · 10 of 46 positions shown, 11 images · non-contrast
Comparison: 08/10/2015

CLINICAL DATA: Bilateral flank pain, left flank pain for 4 days

EXAM:
CT ABDOMEN AND PELVIS WITHOUT CONTRAST
TECHNIQUE: Multidetector CT imaging of the abdomen and pelvis was performed
following the standard protocol without IV contrast.

[Series 201: stone study, idose (2) · axial · 0.71mm/px · z∈[-19,+411]mm · 7 of 104 slices shown, 8 images]
[im 9/104  soft-tissue]
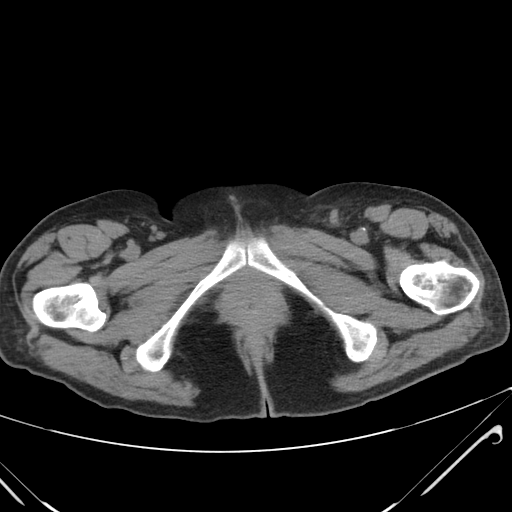
[im 9/104  bone]
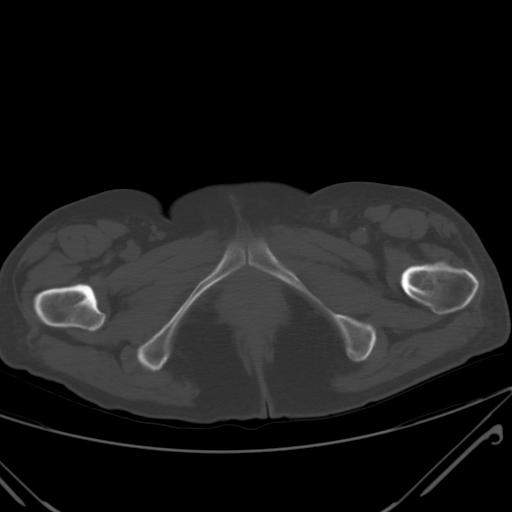
[im 22/104  soft-tissue]
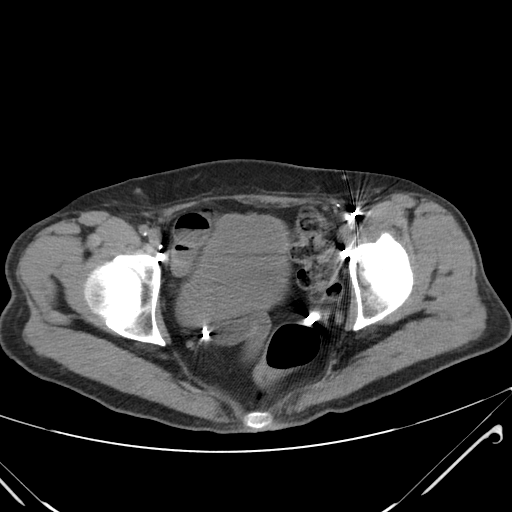
[im 39/104  soft-tissue]
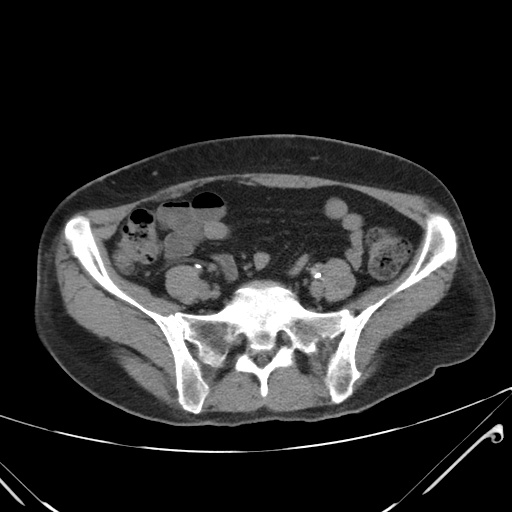
[im 52/104  soft-tissue]
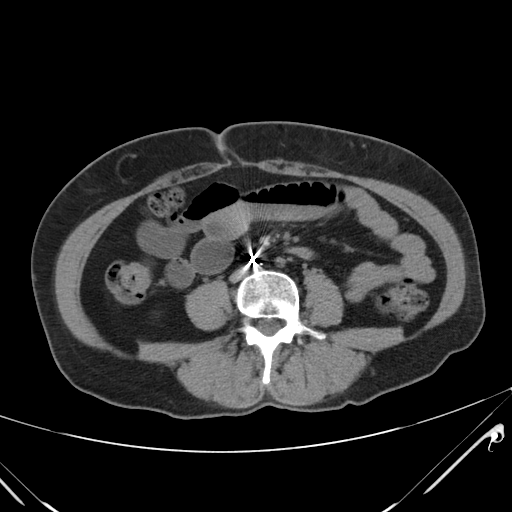
[im 65/104  soft-tissue]
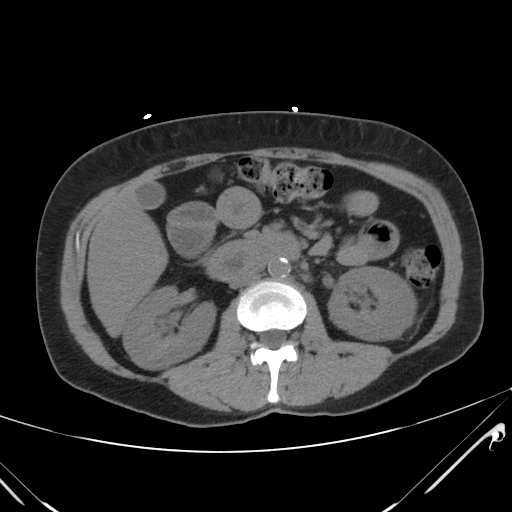
[im 82/104  soft-tissue]
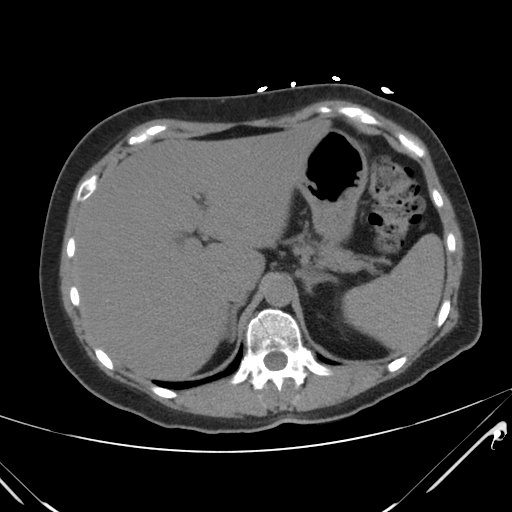
[im 95/104  soft-tissue]
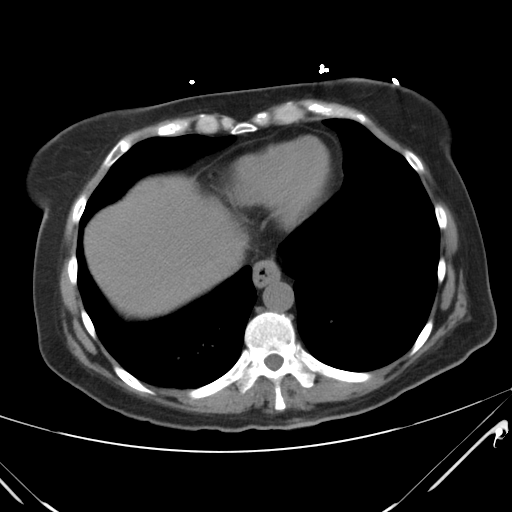

[Series 203: coronals, idose (2) · coronal · 0.45mm/px · 3 of 130 slices shown]
[im 44/130  soft-tissue]
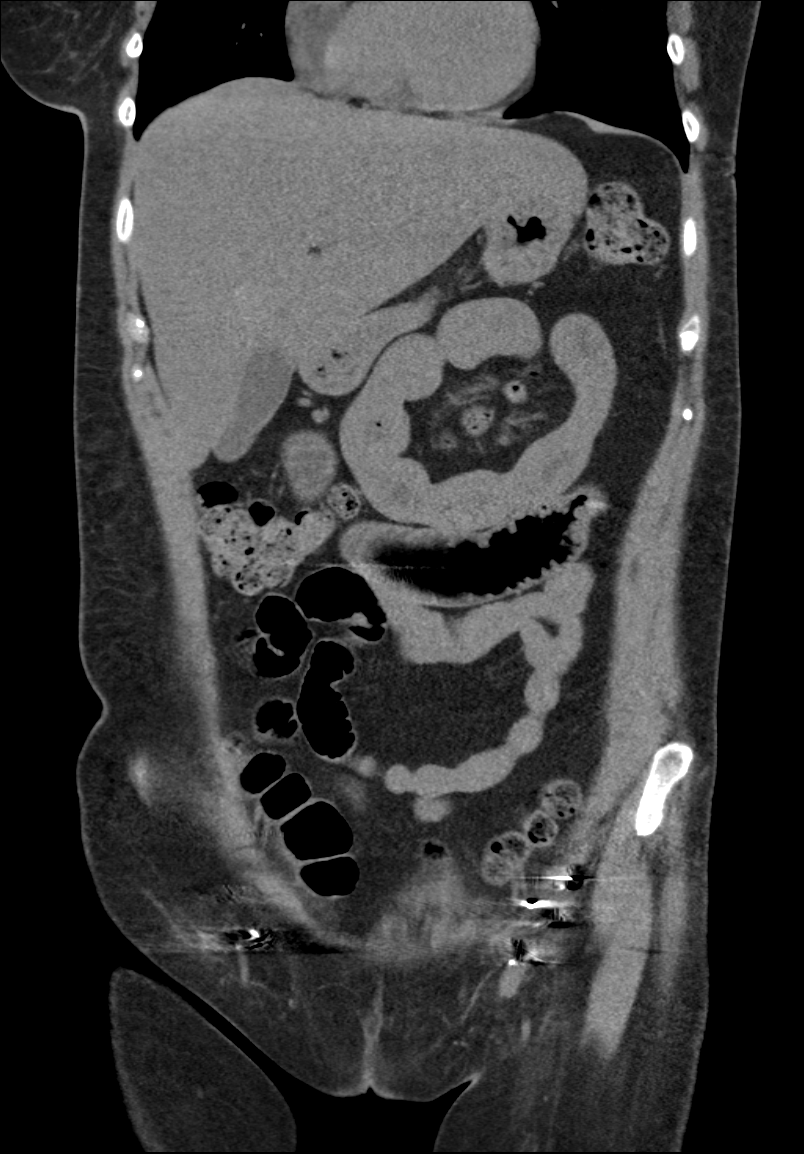
[im 58/130  soft-tissue]
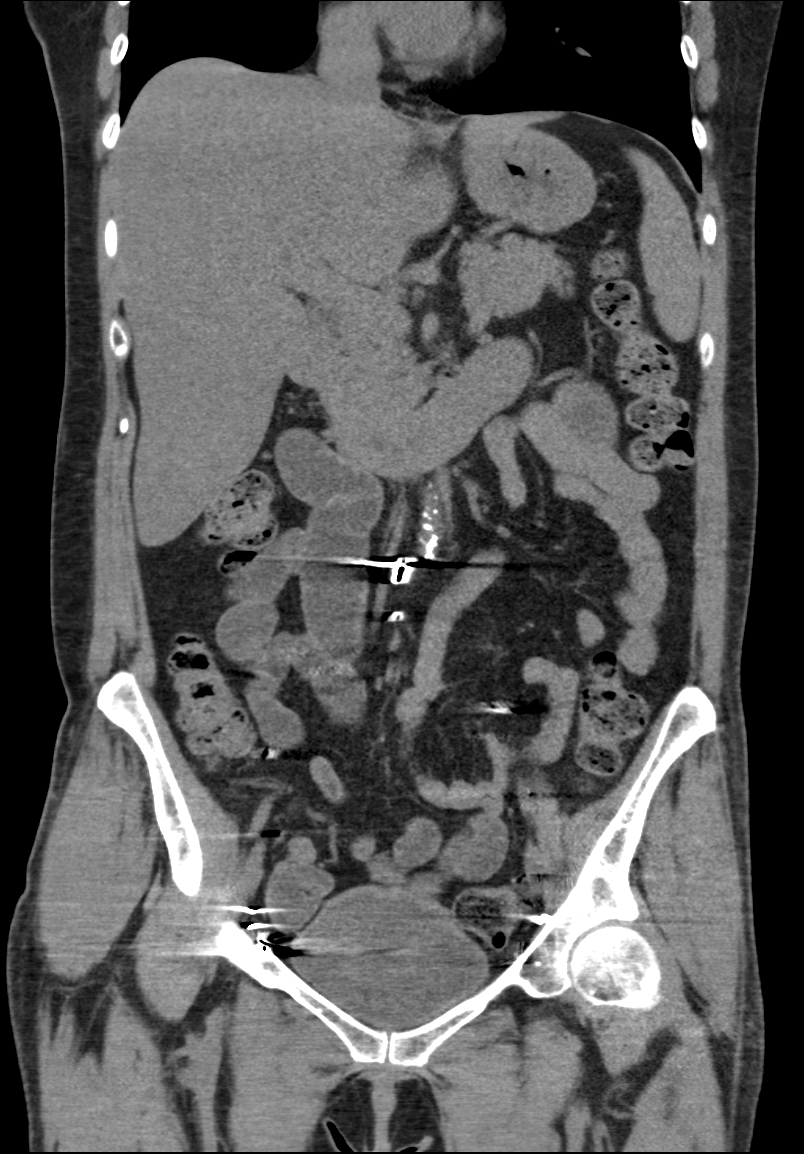
[im 72/130  soft-tissue]
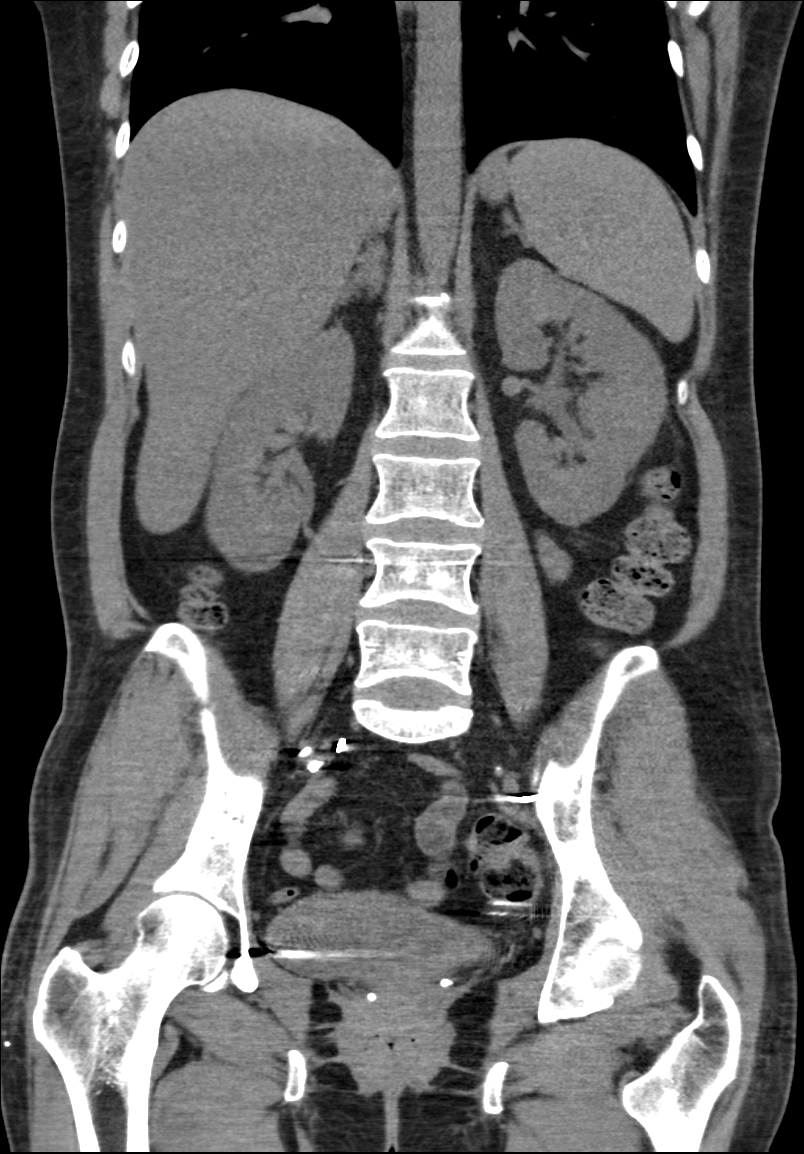

[10 of 46 positions shown; findings below may reference images not displayed]

FINDINGS: Lower chest:  The lung bases are unremarkable.

Hepatobiliary: No intrahepatic biliary ductal dilatation. No
calcified gallstones are noted within gallbladder. No CBD
dilatation.

Pancreas: Unenhanced pancreas is unremarkable.

Spleen: Unenhanced spleen is unremarkable.

Adrenals/Urinary Tract: Low-density nodule left adrenal gland
probable adenoma measures 1 cm stable in size in appearance from
prior exam. The right adrenal gland is unremarkable.

Unenhanced kidneys are symmetrical in size. No hydronephrosis or
hydroureter. There is subtle mild left perinephric and proximal left
periureteral stranding. Left urinary tract inflammation or a recent
passed left ureteral calculus cannot be excluded. No definite
ureteral calculi are noted bilaterally. No calcified calculi are
noted within urinary bladder. Small amount of air is noted within
anterior aspect of urinary bladder. This may be due to recent
instrumentation or infection. Clinical correlation is necessary.

Again noted punctate nonobstructive renal calcifications bilateral
without significant change from prior exam. The largest
nonobstructive calculus in midpole of the right kidney measures 4
mm. The largest punctate calculus in upper pole of the left kidney
measures 2 mm.

Stomach/Bowel: No gastric outlet obstruction. There is subtle mild
distension of distal jejunum with some fluid without thickening of
small bowel wall. Mild enteritis or ileus cannot be excluded. No
definite evidence of small bowel obstruction. Moderate stool noted
in right colon transverse colon and descending colon. Surgical clips
within retroperitoneum and bilateral posterior pelvis and anterior
pelvis again noted.

Vascular/Lymphatic: No retroperitoneal or mesenteric adenopathy. No
aortic aneurysm. Atherosclerotic calcifications are noted abdominal
aorta and iliac arteries.

Reproductive: The patient is status post hysterectomy

Other: No ascites or free air.  No inguinal adenopathy.

Musculoskeletal: No destructive bony lesions are noted. Sagittal
images of the spine shows stable minimal compression deformity upper
endplate of L1 vertebral body.
IMPRESSION: 1. Again noted bilateral nonobstructive nephrolithiasis. No
hydronephrosis or hydroureter. No definite calcified ureteral
calculi. There is minimal left perinephric stranding and left
proximal periureteral stranding. Subtle left urinary tract
inflammation or a recent passed left ureteral calculus cannot be
excluded. Clinical correlation is necessary.
2. There is no evidence of calcified calculi within urinary bladder.
Small amount of air within anterior aspect of the bladder may be
post recent instrumentation. Infection cannot be excluded. Clinical
correlation is necessary.
3. Status post hysterectomy.
4. Stable minimal compression deformity upper endplate of L1
vertebral body.
5. Minimal distension of distal jejunum with some fluid without
thickening of jejunal wall. Mild ileus or enteritis cannot be
excluded. No definite evidence of small bowel obstruction free
